# Patient Record
Sex: Female | Born: 1960 | Race: White | Hispanic: No | Marital: Single | Smoking: Never smoker
Health system: Southern US, Community
[De-identification: ages and names within clinical notes are randomized; demographics above are authoritative.]

## PROBLEM LIST (undated history)

## (undated) DIAGNOSIS — Z944 Liver transplant status: Secondary | ICD-10-CM

## (undated) DIAGNOSIS — K769 Liver disease, unspecified: Secondary | ICD-10-CM

## (undated) DIAGNOSIS — K469 Unspecified abdominal hernia without obstruction or gangrene: Secondary | ICD-10-CM

## (undated) HISTORY — PX: HERNIA REPAIR: SHX51

## (undated) HISTORY — PX: LIVER TRANSPLANT: SHX410

## (undated) HISTORY — DX: Liver disease, unspecified: K76.9

## (undated) HISTORY — DX: Unspecified abdominal hernia without obstruction or gangrene: K46.9

---

## 2004-07-13 DIAGNOSIS — K469 Unspecified abdominal hernia without obstruction or gangrene: Secondary | ICD-10-CM

## 2004-07-13 HISTORY — DX: Unspecified abdominal hernia without obstruction or gangrene: K46.9

## 2007-07-18 ENCOUNTER — Ambulatory Visit: Payer: Self-pay | Admitting: Family Medicine

## 2007-07-19 ENCOUNTER — Ambulatory Visit: Payer: Self-pay | Admitting: Family Medicine

## 2007-08-05 ENCOUNTER — Ambulatory Visit: Payer: Self-pay | Admitting: Family Medicine

## 2007-08-09 ENCOUNTER — Ambulatory Visit: Payer: Self-pay | Admitting: Family Medicine

## 2007-08-26 ENCOUNTER — Ambulatory Visit: Payer: Self-pay | Admitting: Internal Medicine

## 2007-08-29 ENCOUNTER — Other Ambulatory Visit: Payer: Self-pay

## 2007-08-29 ENCOUNTER — Emergency Department: Payer: Self-pay | Admitting: Emergency Medicine

## 2008-04-16 ENCOUNTER — Other Ambulatory Visit: Admission: RE | Admit: 2008-04-16 | Discharge: 2008-04-16 | Payer: Self-pay | Admitting: Family Medicine

## 2008-05-06 IMAGING — RF DG UGI W/O KUB
1 series · 15 of 24 positions shown · non-contrast
Comparison: none

REASON FOR EXAM: reflux
COMMENTS:

[Series 1: run · 15 of 33 slices shown]
[im 1/33]
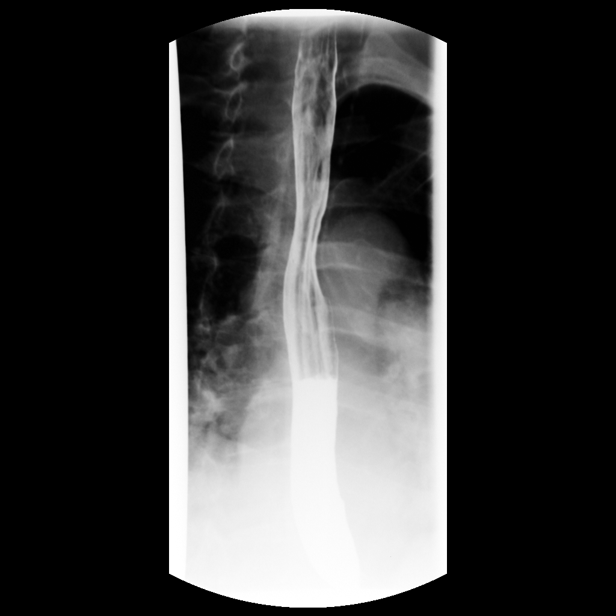
[im 3/33]
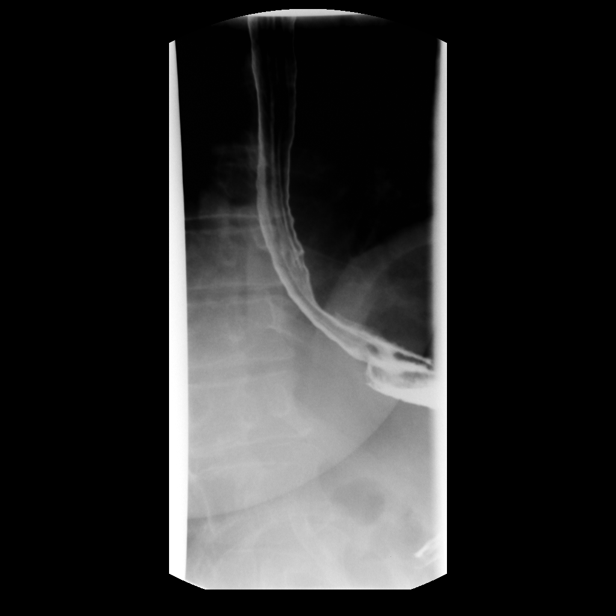
[im 6/33]
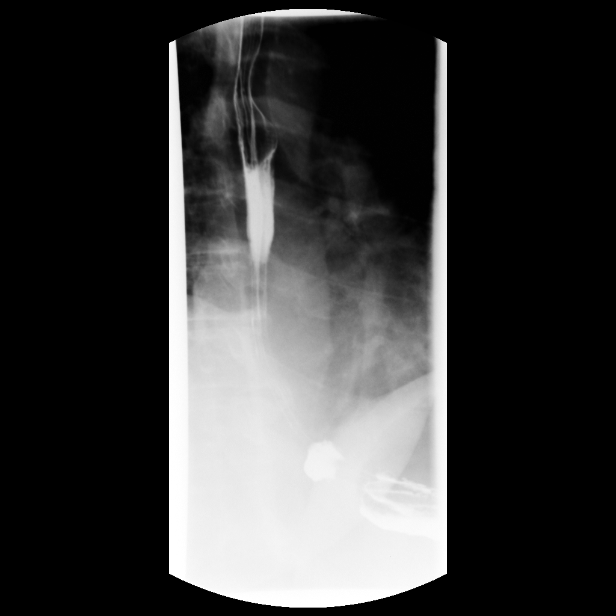
[im 7/33]
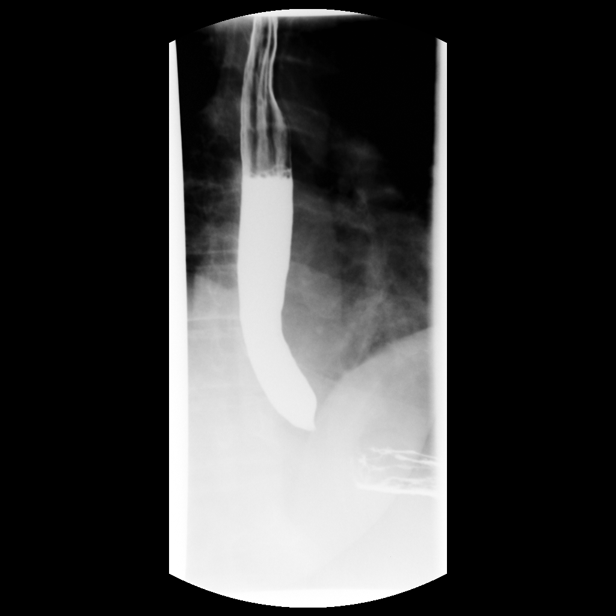
[im 10/33]
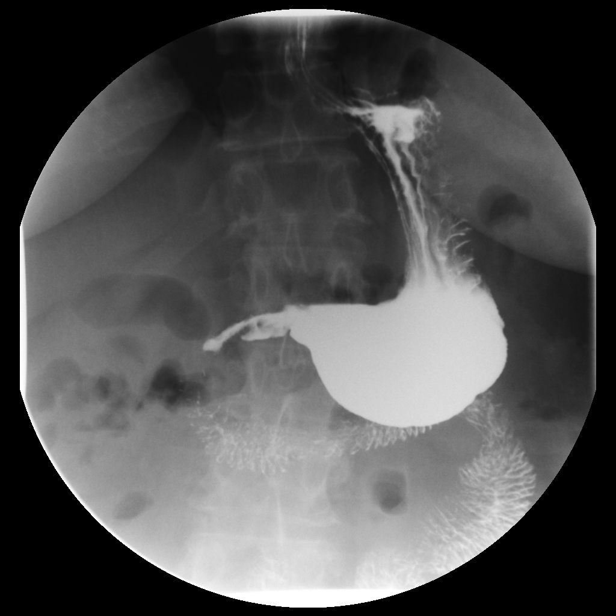
[im 12/33]
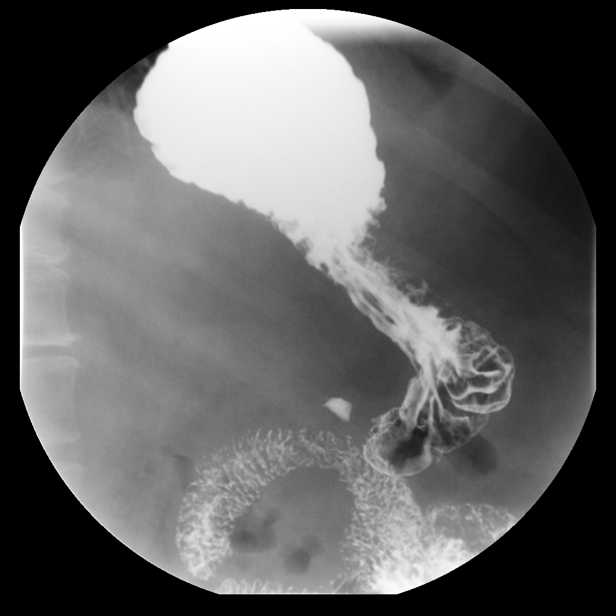
[im 14/33]
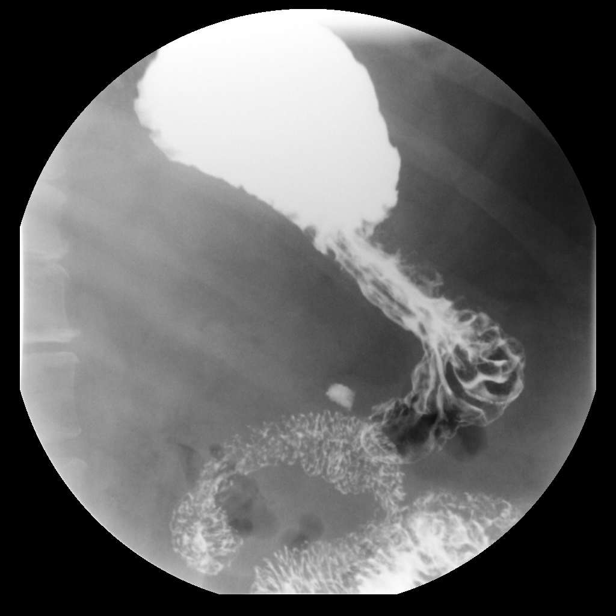
[im 17/33]
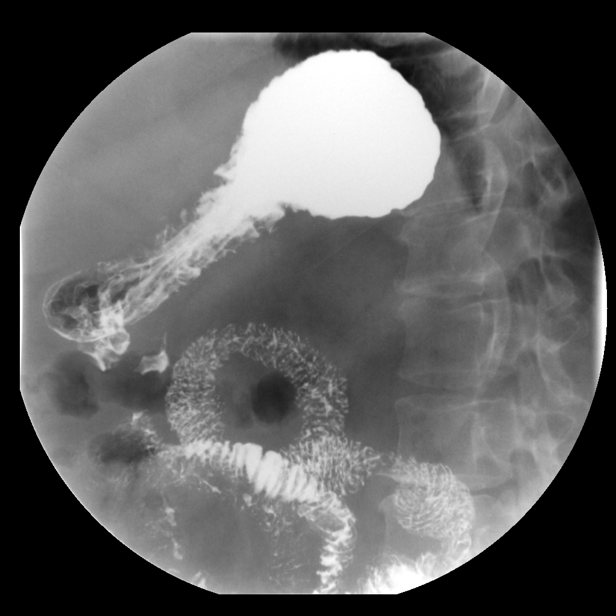
[im 19/33]
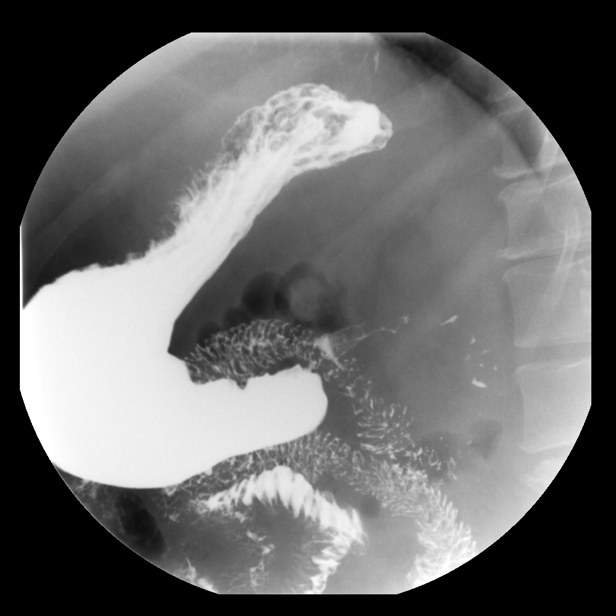
[im 21/33]
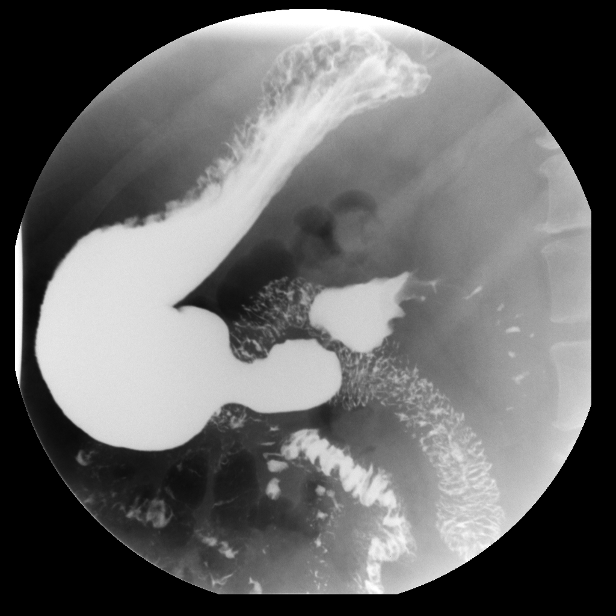
[im 23/33]
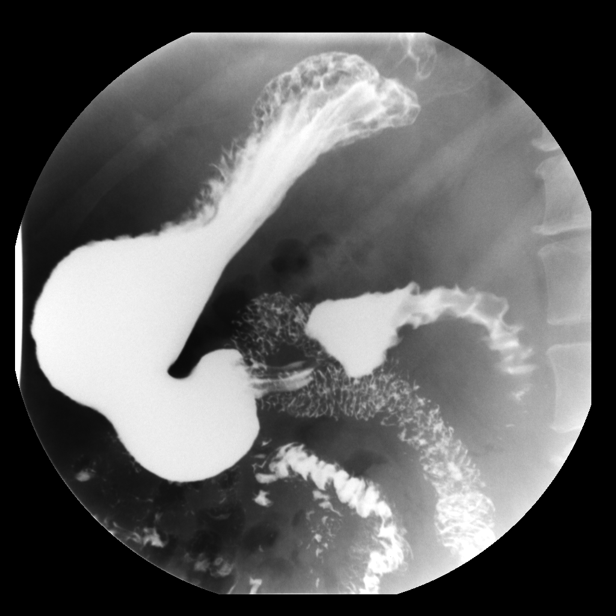
[im 26/33]
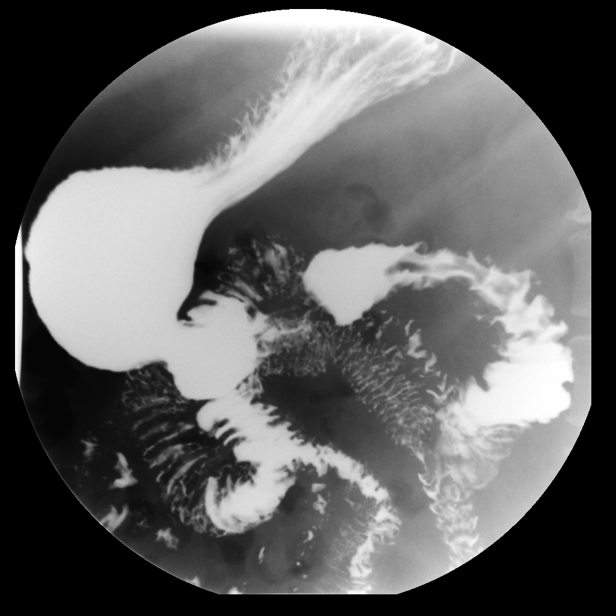
[im 28/33]
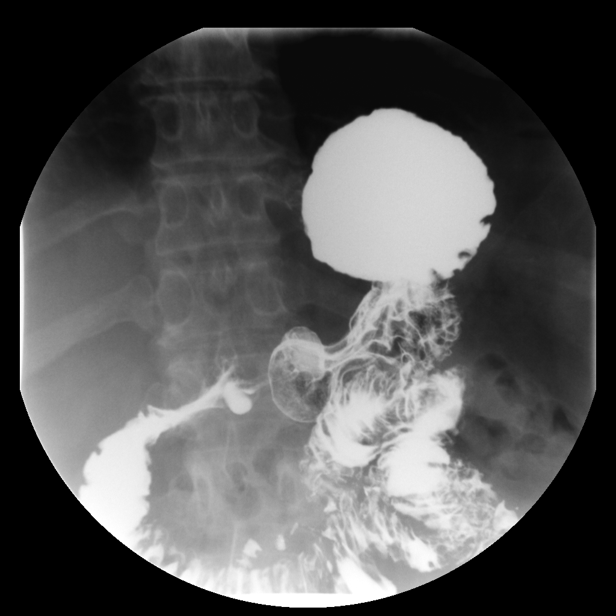
[im 30/33]
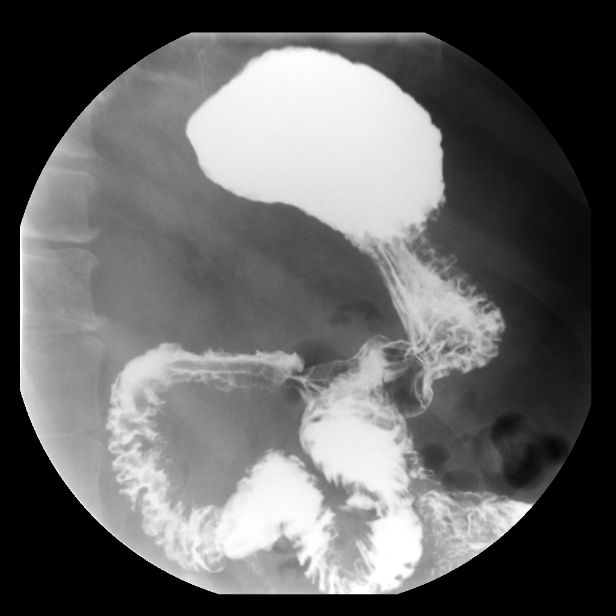
[im 33/33]
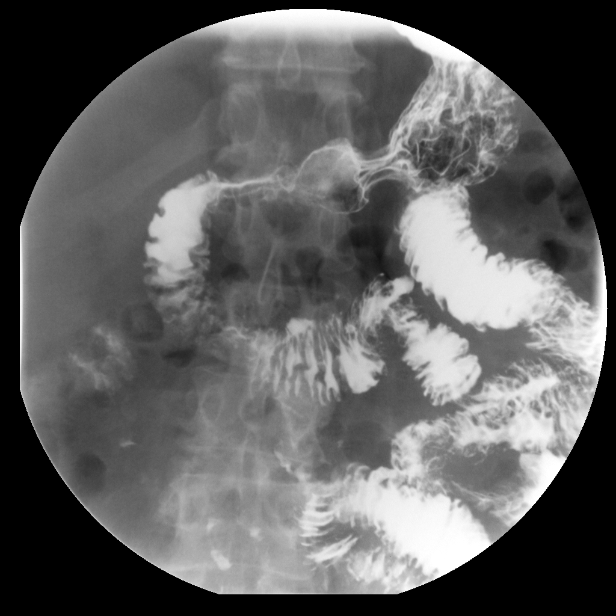

[15 of 24 positions shown; findings below may reference images not displayed]

PROCEDURE:     FL  - FL UPPER GI  - August 26, 2007 [DATE]

RESULT:     The patient was given a single cup of oral barium.  The patient
could not tolerate further administration of barium and thus this study is
limited.  The barium was administered status post effervescent crystals and
during the procedure the patient released the majority of the gas produced
by the crystals through burping. The patient was instructed not to burp but
explained that her burping was uncontrollable.

Evaluation of the stomach, duodenum and duodenal bulb demonstrate no
radiographic abnormalities.  There is appropriate rotation of the duodenum
and placement of the ligament of Treitz. The duodenal bulb is unremarkable.
There is no evidence of gastroesophageal reflux or gross evidence of a
hiatal hernia.
IMPRESSION: Upper GI evaluation as described above without evidence of gross
abnormalities.

## 2009-02-05 ENCOUNTER — Emergency Department: Payer: Self-pay | Admitting: Emergency Medicine

## 2010-03-17 ENCOUNTER — Emergency Department (HOSPITAL_COMMUNITY): Admission: EM | Admit: 2010-03-17 | Discharge: 2010-03-17 | Payer: Self-pay | Admitting: Emergency Medicine

## 2010-04-14 ENCOUNTER — Encounter: Admission: RE | Admit: 2010-04-14 | Discharge: 2010-04-14 | Payer: Self-pay | Admitting: General Surgery

## 2010-07-18 ENCOUNTER — Ambulatory Visit (HOSPITAL_COMMUNITY): Admission: RE | Admit: 2010-07-18 | Payer: Self-pay | Source: Home / Self Care | Admitting: General Surgery

## 2010-07-24 ENCOUNTER — Encounter
Admission: RE | Admit: 2010-07-24 | Discharge: 2010-07-24 | Payer: Self-pay | Source: Home / Self Care | Attending: Family Medicine | Admitting: Family Medicine

## 2010-08-01 ENCOUNTER — Encounter
Admission: RE | Admit: 2010-08-01 | Discharge: 2010-08-01 | Payer: Self-pay | Source: Home / Self Care | Attending: Family Medicine | Admitting: Family Medicine

## 2010-08-06 LAB — DIFFERENTIAL
Basophils Absolute: 0 10*3/uL (ref 0.0–0.1)
Eosinophils Relative: 3 % (ref 0–5)
Lymphocytes Relative: 37 % (ref 12–46)
Lymphs Abs: 1.4 10*3/uL (ref 0.7–4.0)
Monocytes Absolute: 0.4 10*3/uL (ref 0.1–1.0)
Neutro Abs: 1.9 10*3/uL (ref 1.7–7.7)

## 2010-08-06 LAB — COMPREHENSIVE METABOLIC PANEL
ALT: 16 U/L (ref 0–35)
AST: 24 U/L (ref 0–37)
Albumin: 3.7 g/dL (ref 3.5–5.2)
Alkaline Phosphatase: 63 U/L (ref 39–117)
Chloride: 107 mEq/L (ref 96–112)
GFR calc Af Amer: >60 mL/min (ref >60)
Potassium: 4.6 mEq/L (ref 3.5–5.1)
Sodium: 141 mEq/L (ref 135–145)
Total Bilirubin: 0.8 mg/dL (ref 0.3–1.2)
Total Protein: 6.8 g/dL (ref 6.0–8.3)

## 2010-08-06 LAB — SURGICAL PCR SCREEN
MRSA, PCR: NEGATIVE
Staphylococcus aureus: NEGATIVE

## 2010-08-06 LAB — URINALYSIS, ROUTINE W REFLEX MICROSCOPIC
Bilirubin Urine: NEGATIVE
Hgb urine dipstick: NEGATIVE
Ketones, ur: NEGATIVE mg/dL
Protein, ur: NEGATIVE mg/dL
Urine Glucose, Fasting: NEGATIVE mg/dL
Urobilinogen, UA: 0.2 mg/dL (ref 0.0–1.0)

## 2010-08-06 LAB — APTT: aPTT: 31 seconds (ref 24–37)

## 2010-08-06 LAB — CBC
HCT: 36.7 % (ref 36.0–46.0)
Hemoglobin: 12.1 g/dL (ref 12.0–15.0)
MCV: 92.7 fL (ref 78.0–100.0)
RDW: 12.8 % (ref 11.5–15.5)
WBC: 3.9 10*3/uL — ABNORMAL LOW (ref 4.0–10.5)

## 2010-08-06 LAB — URINE MICROSCOPIC-ADD ON

## 2010-08-06 LAB — PROTIME-INR: INR: 1.07 (ref 0.00–1.49)

## 2010-08-12 ENCOUNTER — Inpatient Hospital Stay (HOSPITAL_COMMUNITY)
Admission: RE | Admit: 2010-08-12 | Discharge: 2010-08-15 | DRG: 354 | Disposition: A | Discharge status: Home / Self Care | Payer: PRIVATE HEALTH INSURANCE | Attending: General Surgery | Admitting: General Surgery

## 2010-08-12 DIAGNOSIS — Z79899 Other long term (current) drug therapy: Secondary | ICD-10-CM

## 2010-08-12 DIAGNOSIS — Z944 Liver transplant status: Secondary | ICD-10-CM

## 2010-08-12 DIAGNOSIS — K432 Incisional hernia without obstruction or gangrene: Principal | ICD-10-CM | POA: Diagnosis present

## 2010-08-12 NOTE — Op Note (Signed)
NAMEALFRED, ECKLEY               ACCOUNT NO.:  [id number]  MEDICAL RECORD NO.:  [id number]          PATIENT TYPE:  INP  LOCATION:  1526                         FACILITY:  [facility]  PHYSICIAN:  Angelia Mould. Derrell Lolling, M.D.DATE OF BIRTH:  [birth year]  DATE OF PROCEDURE:  08/12/2010 DATE OF DISCHARGE:                              OPERATIVE REPORT   PREOPERATIVE DIAGNOSES: 1. Ventral incisional hernia. 2. History of liver transplant.  POSTOPERATIVE DIAGNOSES: 1. Ventral incisional hernia. 2. History of liver transplant.  OPERATION PERFORMED:  Open repair ventral incisional hernia with Inlay PhysioMesh.  SURGEON:  Claud Kelp, M.D.  FIRST ASSISTANT:  Maisie Fus A. Cornett, M.D.  OPERATIVE INDICATIONS:  This is a 50 year old Caucasian female who had a liver transplant in [place] in 2009.  She also had a TIPS procedure.  She done well with the liver transplant and is on anti-rejection drugs.  She has done well and is working full-time.  She has noticed a bulge and some discomfort in the upper midline for about a year.  I have been following her in the office for a few months and have evaluated her for her hernia.  On exam, she has a reducible hernia in the subxiphoid area in the midline at the apex of her Chevron incision.  The defect is probably 4-5 cm in size.  I have done a CT scan to map out the anatomy of the liver transplant.  She is felt to be an acceptable candidate for open surgery, is brought to operating room electively.  OPERATIVE TECHNIQUE:  Following induction of the general endotracheal anesthesia, a Foley catheter was placed.  Intravenous antibiotics were given.  Surgical time-out was held identifying correct patient and correct procedure.  I opened up the central portion of the Chevron incision superiorly going through the old scar.  Dissection was carried down through subcutaneous tissue.  I entered a moderately large hernia sac and then was able to identify the edge  of the fascia and palpate it with the intra-abdominal contents.  Under direct vision, I debrided the hernia sac.  Then with Kocher clamps, lifting up the edge of the fascia, I slowly took down the adhesions on the undersurface of the abdominal wall.  She had a few at omental adhesions inferiorly which I took down. There were no intestinal adhesions inferiorly.  Superiorly, the left lateral edge of the liver was encountered but was easily dissected away from the undersurface with electrocautery, and there was no bleeding. Once I did all of the adhesions, I had a 4-5 cm rim of normal abdominal wall both externally and internally.  I undermined the  subcutaneous tissue a little bit all the way around.  Self-retaining retractors were placed.  I brought a piece of PhysioMesh to the operative field.  I trimmed this down to about 12-14 cm in diameter. The defect measured about 5 cm transversely by about 4 cm vertically. The mesh was then sutured to the abdominal wall as an Inlay technique. I used interrupted mattress sutures of #1 Novafil placing them first at the right corner and then individually placing mattress sutures circumferentially.  We tied the  sutures from time to time as we went to make sure that we get the mesh smoothed out and there were no gaps between the fixation sutures.  The last 7 or 8 sutures were left untied until we had them all in place.  We then inserted the mesh in the abdominal cavity and lifted up all of the Novafil sutures.  We felt all the way around, and there was no defect in the repair, and there was no intra-abdominal viscera caught in the repair.  We then tied all of the sutures.  We irrigated the wound extensively.  Hemostasis was excellent and received electrocautery.  The hernia defect was then closed on top of the mesh with multiple interrupted sutures of #1 Novafil.  A 19- [place] Blake drain was placed in the deep subcutaneous tissue and brought out in the  left upper quadrant through a separate stab incision, sutured to the skin with nylon suture and connected to suction bulb. Subcutaneous tissue was closed with interrupted suture of 3-0 Vicryl and skin closed with skin staples.  Clean bandages and a Velcro binder were placed.  The patient tolerated the procedure well, was taken recovery in stable condition.  Estimated blood loss was about 50 mL.  Complications none.  Sponge, needles, and instrument counts were correct.     Angelia Mould. Derrell Lolling, M.D.     HMI/MEDQ  D:  08/12/2010  T:  08/12/2010  Job:  [id number]  cc:   Stacie Acres. Cliffton Asters, M.D. Fax: [phone]  Dr. Quita Skye. [facility] [place] [address]  Electronically Signed by Claud Kelp M.D. on 08/12/2010 05:53:25 PM

## 2010-08-19 ENCOUNTER — Inpatient Hospital Stay (HOSPITAL_COMMUNITY)
Admission: EM | Admit: 2010-08-19 | Discharge: 2010-08-25 | DRG: 393 | Disposition: A | Discharge status: Home / Self Care | Payer: No Typology Code available for payment source | Attending: General Surgery | Admitting: General Surgery

## 2010-08-19 ENCOUNTER — Emergency Department (HOSPITAL_COMMUNITY): Payer: No Typology Code available for payment source

## 2010-08-19 DIAGNOSIS — Z79899 Other long term (current) drug therapy: Secondary | ICD-10-CM

## 2010-08-19 DIAGNOSIS — K653 Choleperitonitis: Secondary | ICD-10-CM | POA: Diagnosis present

## 2010-08-19 DIAGNOSIS — L539 Erythematous condition, unspecified: Secondary | ICD-10-CM | POA: Diagnosis not present

## 2010-08-19 DIAGNOSIS — Y832 Surgical operation with anastomosis, bypass or graft as the cause of abnormal reaction of the patient, or of later complication, without mention of misadventure at the time of the procedure: Secondary | ICD-10-CM | POA: Diagnosis present

## 2010-08-19 DIAGNOSIS — Z944 Liver transplant status: Secondary | ICD-10-CM

## 2010-08-19 DIAGNOSIS — N289 Disorder of kidney and ureter, unspecified: Secondary | ICD-10-CM | POA: Diagnosis present

## 2010-08-19 DIAGNOSIS — E039 Hypothyroidism, unspecified: Secondary | ICD-10-CM | POA: Diagnosis present

## 2010-08-19 DIAGNOSIS — Z9889 Other specified postprocedural states: Secondary | ICD-10-CM

## 2010-08-19 DIAGNOSIS — K929 Disease of digestive system, unspecified: Principal | ICD-10-CM | POA: Diagnosis present

## 2010-08-19 DIAGNOSIS — K831 Obstruction of bile duct: Secondary | ICD-10-CM | POA: Diagnosis present

## 2010-08-19 DIAGNOSIS — K219 Gastro-esophageal reflux disease without esophagitis: Secondary | ICD-10-CM | POA: Diagnosis present

## 2010-08-19 DIAGNOSIS — K5900 Constipation, unspecified: Secondary | ICD-10-CM | POA: Diagnosis not present

## 2010-08-19 DIAGNOSIS — R0902 Hypoxemia: Secondary | ICD-10-CM | POA: Diagnosis present

## 2010-08-19 DIAGNOSIS — K838 Other specified diseases of biliary tract: Secondary | ICD-10-CM | POA: Diagnosis present

## 2010-08-19 LAB — URINALYSIS, ROUTINE W REFLEX MICROSCOPIC
Ketones, ur: 15 mg/dL — AB
Nitrite: POSITIVE — AB
Protein, ur: 30 mg/dL — AB
Urobilinogen, UA: 1 mg/dL (ref 0.0–1.0)
pH: 5 (ref 5.0–8.0)

## 2010-08-19 LAB — DIFFERENTIAL
Basophils Absolute: 0 10*3/uL (ref 0.0–0.1)
Lymphocytes Relative: 5 % — ABNORMAL LOW (ref 12–46)
Lymphs Abs: 0.5 10*3/uL — ABNORMAL LOW (ref 0.7–4.0)
Monocytes Absolute: 0.7 10*3/uL (ref 0.1–1.0)
Neutro Abs: 10.7 10*3/uL — ABNORMAL HIGH (ref 1.7–7.7)

## 2010-08-19 LAB — URINE MICROSCOPIC-ADD ON

## 2010-08-19 LAB — CBC
Platelets: 174 10*3/uL (ref 150–400)
RBC: 4.35 MIL/uL (ref 3.87–5.11)
RDW: 12.7 % (ref 11.5–15.5)
WBC: 11.9 10*3/uL — ABNORMAL HIGH (ref 4.0–10.5)

## 2010-08-19 LAB — COMPREHENSIVE METABOLIC PANEL
AST: 20 U/L (ref 0–37)
Albumin: 3.4 g/dL — ABNORMAL LOW (ref 3.5–5.2)
Alkaline Phosphatase: 120 U/L — ABNORMAL HIGH (ref 39–117)
BUN: 22 mg/dL (ref 6–23)
Chloride: 104 mEq/L (ref 96–112)
GFR calc Af Amer: 55 mL/min — ABNORMAL LOW (ref >60)
Potassium: 4 mEq/L (ref 3.5–5.1)
Total Protein: 7.1 g/dL (ref 6.0–8.3)

## 2010-08-19 LAB — LACTIC ACID, PLASMA: Lactic Acid, Venous: 2.2 mmol/L (ref 0.5–2.2)

## 2010-08-19 LAB — APTT: aPTT: 31 seconds (ref 24–37)

## 2010-08-19 LAB — LIPASE, BLOOD: Lipase: 18 U/L (ref 11–59)

## 2010-08-19 LAB — BLOOD GAS, ARTERIAL
Drawn by: 330991
pCO2 arterial: 34.2 mmHg — ABNORMAL LOW (ref 35.0–45.0)
pH, Arterial: 7.398 (ref 7.350–7.400)

## 2010-08-19 LAB — CK TOTAL AND CKMB (NOT AT ARMC)
CK, MB: 0.5 ng/mL (ref 0.3–4.0)
Relative Index: INVALID (ref 0.0–2.5)
Total CK: 23 U/L (ref 7–177)

## 2010-08-19 LAB — PROTIME-INR: INR: 1.16 (ref 0.00–1.49)

## 2010-08-19 LAB — AMYLASE: Amylase: 58 U/L (ref 0–105)

## 2010-08-19 MED ORDER — IOHEXOL 300 MG/ML  SOLN
100.0000 mL | Freq: Once | INTRAMUSCULAR | Status: AC | PRN
  Administered 2010-08-19: 100 mL via INTRAVENOUS

## 2010-08-20 ENCOUNTER — Inpatient Hospital Stay (HOSPITAL_COMMUNITY): Payer: No Typology Code available for payment source

## 2010-08-20 LAB — CBC
HCT: 38 % (ref 36.0–46.0)
MCH: 31.2 pg (ref 26.0–34.0)
MCHC: 34.5 g/dL (ref 30.0–36.0)
MCV: 90.5 fL (ref 78.0–100.0)
Platelets: 172 10*3/uL (ref 150–400)
RDW: 12.8 % (ref 11.5–15.5)
WBC: 14.2 10*3/uL — ABNORMAL HIGH (ref 4.0–10.5)

## 2010-08-20 LAB — COMPREHENSIVE METABOLIC PANEL
Alkaline Phosphatase: 109 U/L (ref 39–117)
BUN: 19 mg/dL (ref 6–23)
Creatinine, Ser: 1.08 mg/dL (ref 0.4–1.2)
Glucose, Bld: 129 mg/dL — ABNORMAL HIGH (ref 70–99)
Potassium: 4.9 mEq/L (ref 3.5–5.1)
Total Bilirubin: 1.6 mg/dL — ABNORMAL HIGH (ref 0.3–1.2)
Total Protein: 6 g/dL (ref 6.0–8.3)

## 2010-08-20 LAB — LIPASE, BLOOD: Lipase: 13 U/L (ref 11–59)

## 2010-08-20 MED ORDER — IOHEXOL 300 MG/ML  SOLN
100.0000 mL | Freq: Once | INTRAMUSCULAR | Status: AC | PRN
  Administered 2010-08-20: 100 mL via INTRAVENOUS

## 2010-08-21 LAB — CBC
HCT: 31.5 % — ABNORMAL LOW (ref 36.0–46.0)
Platelets: 159 10*3/uL (ref 150–400)
RBC: 3.43 MIL/uL — ABNORMAL LOW (ref 3.87–5.11)
RDW: 12.9 % (ref 11.5–15.5)
WBC: 9.4 10*3/uL (ref 4.0–10.5)

## 2010-08-21 LAB — COMPREHENSIVE METABOLIC PANEL
ALT: 8 U/L (ref 0–35)
Albumin: 2.3 g/dL — ABNORMAL LOW (ref 3.5–5.2)
Alkaline Phosphatase: 81 U/L (ref 39–117)
Chloride: 108 mEq/L (ref 96–112)
Potassium: 4.4 mEq/L (ref 3.5–5.1)
Sodium: 136 mEq/L (ref 135–145)
Total Protein: 5 g/dL — ABNORMAL LOW (ref 6.0–8.3)

## 2010-08-22 LAB — DIFFERENTIAL
Eosinophils Absolute: 0.1 10*3/uL (ref 0.0–0.7)
Eosinophils Relative: 1 % (ref 0–5)
Lymphs Abs: 1.1 10*3/uL (ref 0.7–4.0)
Monocytes Absolute: 0.5 10*3/uL (ref 0.1–1.0)
Monocytes Relative: 6 % (ref 3–12)

## 2010-08-22 LAB — BASIC METABOLIC PANEL
BUN: 21 mg/dL (ref 6–23)
GFR calc non Af Amer: 49 mL/min — ABNORMAL LOW (ref >60)
Glucose, Bld: 95 mg/dL (ref 70–99)
Potassium: 4.3 mEq/L (ref 3.5–5.1)

## 2010-08-22 LAB — CBC
MCH: 31.3 pg (ref 26.0–34.0)
MCV: 91.3 fL (ref 78.0–100.0)
Platelets: 195 10*3/uL (ref 150–400)
RBC: 3.68 MIL/uL — ABNORMAL LOW (ref 3.87–5.11)
RDW: 12.6 % (ref 11.5–15.5)

## 2010-08-23 ENCOUNTER — Inpatient Hospital Stay (HOSPITAL_COMMUNITY): Payer: No Typology Code available for payment source

## 2010-08-23 DIAGNOSIS — R932 Abnormal findings on diagnostic imaging of liver and biliary tract: Secondary | ICD-10-CM

## 2010-08-23 DIAGNOSIS — K831 Obstruction of bile duct: Secondary | ICD-10-CM

## 2010-08-23 LAB — BASIC METABOLIC PANEL
BUN: 19 mg/dL (ref 6–23)
Calcium: 8.5 mg/dL (ref 8.4–10.5)
GFR calc non Af Amer: 46 mL/min — ABNORMAL LOW (ref >60)
Glucose, Bld: 87 mg/dL (ref 70–99)
Sodium: 137 mEq/L (ref 135–145)

## 2010-08-23 LAB — CBC
HCT: 28.4 % — ABNORMAL LOW (ref 36.0–46.0)
MCHC: 33.8 g/dL (ref 30.0–36.0)
RDW: 12.4 % (ref 11.5–15.5)

## 2010-08-24 DIAGNOSIS — K831 Obstruction of bile duct: Secondary | ICD-10-CM

## 2010-08-24 DIAGNOSIS — R933 Abnormal findings on diagnostic imaging of other parts of digestive tract: Secondary | ICD-10-CM

## 2010-08-24 DIAGNOSIS — K769 Liver disease, unspecified: Secondary | ICD-10-CM

## 2010-08-24 LAB — BODY FLUID CULTURE: Gram Stain: NONE SEEN

## 2010-08-24 NOTE — H&P (Signed)
Miranda Freeman, Miranda Freeman               ACCOUNT NO.:  [id number]  MEDICAL RECORD NO.:  [id number]           PATIENT TYPE:  I  LOCATION:  1236                         FACILITY:  [facility]  PHYSICIAN:  Angelia Mould. Derrell Lolling, M.D.DATE OF BIRTH:  08-Oct-1960  DATE OF ADMISSION:  08/19/2010 DATE OF DISCHARGE:                             HISTORY & PHYSICAL   CHIEF COMPLAINT:  Abdominal pain.  HISTORY OF PRESENT ILLNESS:  This is a 50 year old Caucasian female who is now 3 years' status post liver transplantation and preop TIPS procedure.  She did well with that and that surgery was done in [place], [place].  One week ago, I performed an elective incisional ventral hernia repair in the upper midline in her liver transplant incision.  The hernia was repaired with an inlay mesh and the surgery was uneventful.  She was sent home with a drain in the subcutaneous tissue.  She has done relatively well and states that she had been able to eat and have bowel movements until today when at approximately 9 a.m. she developed some left upper quadrant pain, which may have been rather sudden in onset.  There was no nausea or vomiting, no fever or chills, no diarrhea.  She says the pain subsided during the day, but at about 4 p.m., the pain came back, and has been rather severe since.  She feels gassy and full and bloated.  She does not really think she has had a pain like this before.  She came to the [facility] Room where a chest x-ray suggested basilar atelectasis, but abdominal films were fairly unremarkable and there was no sign of obstruction or free air.  She is being admitted for further evaluation and management.  PAST MEDICAL HISTORY: 1. TIPS procedure. 2. Liver transplant in 2009. 3. Hypothyroidism. 4. Gastroesophageal reflux disease.  Otherwise, no medical or surgical problems.  CURRENT MEDICATIONS: 1. Prograf  3 mg in the morning and 2 mg in the evening. 2. CellCept 2 tablets  in the morning and 2 tablets in the evening,     dose unknown. 3. Pepcid p.r.n. 4. Torsemide 20 mg as needed. 5. Ferrous sulfate 1 tablet daily. 6. Dexilant capsule p.r.n. heartburn. 7. Ketoconazole cream 2% p.r.n. minor fungal dermatitis. 8. Levothyroxine 50 mcg per day. 9. She sometimes takes mycophenolate 250 mg 2 tablets twice a day.  DRUG ALLERGIES:  None known.  SOCIAL HISTORY:  She is divorced, has 1 child.  She denies alcohol, tobacco, or any drugs.  She denies taking any Advil or aspirin.  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  FAMILY HISTORY:  Mother, father, and 2 sisters are living and well without medical problems.  REVIEW OF SYSTEMS:  Ten-system review of systems is negative except as described above.  PHYSICAL EXAMINATION:  GENERAL:  A pleasant middle-aged Caucasian female who appears in obvious distress from abdominal pain.  She is a little bit pale.  SKIN:  Warm and dry. VITAL SIGNS:  Initial vital signs showed a temperature of 97.7, heart rate 96, blood pressure 110/74, respiratory rate of 18, and oxygen saturation of 99%. HEENT:  Eyes, sclerae clear.  Extraocular movements are intact.  Ears, mouth, throat, nose, lips, tongue, and oropharynx are without gross lesions. NECK:  Supple, nontender.  No masses.  No jugular venous distention. LUNGS:  Clear to auscultation on anterior exam. HEART:  Regular rate and rhythm, borderline tachycardia.  No ectopy, no murmur. ABDOMEN:  She has a bilateral subcostal incision from her old liver transplant.  The central portion of this incision has been recently reopened and has staples in place.  There is no sign of any wound infection.  There is a Jackson-Pratt drain in the left upper quadrant going to the wound, which is draining odorless and serosanguineous fluid.  She does not seem really distended, but her abdomen has minimal bowel sounds.  She is diffusely tender, but only with a little bit of guarding.  I  do not feel a mass.  I do no feel a hernia.  EXTREMITIES: She moves all 4 extremities well. NEUROLOGIC:  Other than being a little bit inappropriate from the narcotic pain medication, there is no gross motor or sensory deficit.  ADMISSION DATA:  Chest x-ray shows bibasilar atelectasis.  Abdominal x- rays show an unremarkable gas pattern without signs of obstruction or free air.  Hemoglobin 13.6, white blood cell count 11,900 but with a left shift.  Arterial blood gas shows a pH of 7.39, pCO2 of 34, and pO2 of 61 on room air.  EKG shows no acute change.  Complete metabolic panel shows a mildly elevated creatinine, bilirubin of 2.0, a mildly elevated alkaline phosphatase, and normal SGOT and SGPT.  PT and PTT are normal.  ASSESSMENT: 1. Acute abdominal pain of uncertain etiology.  There does not appear     to be any acute wound infection or wound disruption.     Considerations would be perforation, inflammatory or ischemic     process of the intestinal tract, bile leak, or constipation.  Other     considerations might be referred pain from an acute pulmonary     embolism. 2. Status post recent ventral incisional hernia repair. 3. Status post liver transplantation. 4.  dilated common bile duct on CT; question duct stricture.  PLAN: 1. The patient will be hydrated and sent for a CT scan of the chest as     a CT angio to rule out PE and then she will have a CT scan of the     abdomen and pelvis with contrast. 2. She will be admitted to the step-down bed for hydration, pain     control, and proton pump inhibitors.  We will hold off on giving     any antibiotics for now until we know better what the likely     diagnosis is.  I have discussed this with the patient and her family and with Dr. Elias Else who is involved with the family.     Angelia Mould. Derrell Lolling, M.D.     HMI/MEDQ  D:  08/19/2010  T:  08/20/2010  Job:  [id number]  cc:   Stacie Acres. Cliffton Asters, M.D. Fax:  [phone]  Electronically Signed by Claud Kelp M.D. on 08/24/2010 04:01:09 PM

## 2010-08-25 LAB — PREALBUMIN: Prealbumin: 5.4 mg/dL — ABNORMAL LOW (ref 17.0–34.0)

## 2010-08-25 LAB — COMPREHENSIVE METABOLIC PANEL
ALT: 11 U/L (ref 0–35)
AST: 13 U/L (ref 0–37)
Calcium: 8.2 mg/dL — ABNORMAL LOW (ref 8.4–10.5)
GFR calc Af Amer: >60 mL/min (ref >60)
Sodium: 140 mEq/L (ref 135–145)
Total Protein: 4.6 g/dL — ABNORMAL LOW (ref 6.0–8.3)

## 2010-08-25 LAB — CBC
MCHC: 34.6 g/dL (ref 30.0–36.0)
Platelets: 220 10*3/uL (ref 150–400)
RDW: 12.2 % (ref 11.5–15.5)

## 2010-08-26 NOTE — Discharge Summary (Signed)
NAMECALE, DECAROLIS               ACCOUNT NO.:  [id number]  MEDICAL RECORD NO.:  [id number]           PATIENT TYPE:  I  LOCATION:  1236                         FACILITY:  [facility]  PHYSICIAN:  Angelia Mould. Derrell Lolling, M.D.DATE OF BIRTH:  1961/03/13  DATE OF ADMISSION:  08/19/2010 DATE OF DISCHARGE:  08/25/2010                              DISCHARGE SUMMARY   FINAL DIAGNOSES: 1. Bile leak, controlled with percutaneous drainage. 2. Recent ventral incisional hernia repair with mesh. 3. History of TIPS procedure. 4. History of liver transplantation, 2009. 5. Hypothyroidism.  OPERATIONS PERFORMED: 1. Percutaneous drainage of epigastric fluid collection, 08/20/2010 2. Percutaneous drainage of left upper quadrant fluid collection, 08/20/2010 3. ERCP, date February 11.  HISTORY:  This is a 50 year old Caucasian female who had a TIPS procedure and then a liver transplant in [place] in 2009, and she did well with that and is on anti-rejection drugs.  She is working full time.  She came to see me about 2 or 3 months ago with a ventral hernia in the upper midline at the apex of her chevron incision.  She was evaluated for this and she wanted to consider repair.  A CT scan was done and showed a defect about 4 cm to 5 cm in size.  Only the very tip of the left lobe of the liver was anywhere near the hernia.  The intrahepatic bile ducts looked slightly dilated on the CT scan.  Her liver function tests were normal.  A coagulation profile was normal. After discussing with her primary care physician, she was felt to be an acceptable candidate for open surgery.  She was taken to the operating room on August 12, 2009.  I reopened the central portion of the chevron incision.  I dissected out the hernia sac and identified the edges of the fascia.  I lifted up the fascia and debrided a few adhesions inferiorly.  The left lateral edge of the liver was encountered and very minimal dissection was required  to dissect  this away from the undersurface of the fascia without any problem.  We then placed an inlay graft of Phsyiomesh.  She did well and went home in a couple of days.  HOSPITAL COURSE: She was readmitted at this time 7 days postop with severe mid and upper abdominal pain.  CT scan showed a large, slightly rim-enhancing fluid collection under the mesh and another non rim-enhancing fluid collection in the left upper quadrant and some ascites in the pelvis.  She was started on intravenous antibiotics.  Interventional Radiology placed a drain in the subxiphoid area and a second drain in the left upper quadrant area, and both of these areas drained clear bile.  Cultures were mostly negative, although there were a few coagulase- negative staph.  We switched her from [place] to Levaquin on February 13.  Because of the concern that she might have a common bile duct stricture which might be causing elevated biliary pressure, we considered ERCP to evaluate this.  This was discussed with Dr. Rob Bunting here in town, as well as Dr. Sharen Hint, one of the transplant surgeons in [place]. It  was felt reasonable to attempt this.  ERCP was performed on February 11.  Dr. 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  She has been stable since that time. Each drain is draining about 30 cc to 35 cc per 24-hour period, bilious fluid.  She has resumed bowel function and diet, although she is still having some upper abdominal pain and some upper abdominal tenderness. We have removed the skin staples from her incision and removed the subcutaneous drain, and the wound looks good at this time.  We felt that to resolve the situation of the common bile duct stricture and the potentially elevated biliary pressures, which might be complicating the biliary leak, that she might need to  be transferred back to [facility] to her liver transplant service.  She was very much in favor of that.  I discussed this today with Judson Roch, the nurse practitioner with the transplantation service, and with Dr. Sharen Hint, one of the transplant surgeons.  They advised that we go ahead and discharge her from the hospital and they would see her in the clinic at the [facility] in [place], [place], tomorrow and decide what the next steps were.  I expressed my concern to the patient and to the staff in [place] that one of the secondary issues was the possibility of infection of the mesh due to the bile leak and that will have to be dealt with in the future should that occur.  DISCHARGE MEDICATIONS:  Include: 1. Levaquin 750 mg p.o. daily. 2. Synthroid 50 mcg daily. 3. CellCept 1000 mg twice a day. 4. Protonix 40 mg daily. 5. Prograf 2 mg at bedtime and 3 mg in the morning. 6. Dilaudid 4 mg p.o. q.4 h. p.r.n. 7. Lactulose 15 mg to 30 mg p.o. q.6 h. p.r.n. constipation.  The patient was advised to return to see me as needed, but certainly within 1 month.     Angelia Mould. Derrell Lolling, M.D.     HMI/MEDQ  D:  08/25/2010  T:  08/25/2010  Job:  [id number]  cc:   Stacie Acres. Cliffton Asters, M.D. Fax: [phone]  Rachael Fee, MD [address] [place], [place] [address]  Dr. Rosetta Posner [facility] [place], [place]  Electronically Signed by Claud Kelp M.D. on 08/26/2010 06:42:38 AM

## 2010-09-02 ENCOUNTER — Ambulatory Visit
Admission: RE | Admit: 2010-09-02 | Discharge: 2010-09-02 | Disposition: A | Discharge status: Home / Self Care | Payer: No Typology Code available for payment source | Source: Ambulatory Visit | Attending: Family Medicine | Admitting: Family Medicine

## 2010-09-02 ENCOUNTER — Other Ambulatory Visit: Payer: Self-pay | Admitting: Family Medicine

## 2010-09-02 DIAGNOSIS — R52 Pain, unspecified: Secondary | ICD-10-CM

## 2010-09-02 DIAGNOSIS — R609 Edema, unspecified: Secondary | ICD-10-CM

## 2010-09-05 ENCOUNTER — Other Ambulatory Visit (HOSPITAL_COMMUNITY): Payer: Self-pay | Admitting: Family Medicine

## 2010-09-05 DIAGNOSIS — K9189 Other postprocedural complications and disorders of digestive system: Secondary | ICD-10-CM

## 2010-09-05 DIAGNOSIS — Z944 Liver transplant status: Secondary | ICD-10-CM

## 2010-09-13 NOTE — Discharge Summary (Signed)
[name], [name]               ACCOUNT NO.:  [id number]  MEDICAL RECORD NO.:  [id number]           PATIENT TYPE:  I  LOCATION:  1526                         FACILITY:  [facility]  PHYSICIAN:  Miranda Freeman, M.D.DATE OF BIRTH:  Feb 21, 1961  DATE OF ADMISSION:  08/12/2010 DATE OF DISCHARGE:  08/15/2010                              DISCHARGE SUMMARY   FINAL DIAGNOSES: 1. Ventral incisional hernia. 2. History of liver transplantation. 3. History of transjugular intrahepatic portosystemic shunt procedure.  OPERATIONS PERFORMED:  Open repair of ventral incisional hernia with Inlay PhysioMesh, date of surgery August 12, 2010.  HISTORY:  This is a 50 year old Caucasian female who had a liver transplantation by Dr. Quita Freeman. Miranda Freeman at [facility] in 2009, which was preceded by a TIPS procedure.  She did well with that and is on antirejection drugs.  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  She has noticed a small bulge and some discomfort in the upper midline for about 1 year and thinks that this has been getting larger.  She was examined as an outpatient and it was found that she had a reducible hernia in the epigastric area at the apex of the Chevron incision.  We worked her up with a CT scan to determine the location of the left lobe of the liver which appeared to be nearby but not hypertrophied.  We discussed her care and got clearance from her transplant team at [facility] in [place], and plans were made to proceed with elective hernia repair.  HOSPITAL COURSE:  On the day of admission, the patient was taken to operating room and underwent repair of her hernia.  We did this by making incision through the old Chevron incision centrally and dissecting out the fascia, and we also dissected the colon and the left lobe of the liver away from the undersurface of the fascia and do  an Inlay mesh repair and then closed the fascia over the top.  Postoperatively, she did well.  She became ambulatory by the evening of the first postop day, began diet slowly.  She had trouble voiding and had to have a Foley reinserted but then that ultimately came out and she did well.  She had low grade temperature to 100.3 on postop day #1, but that resolved.  By August 15, 2010, she was doing much better with diet, activities, and voiding, and pain was well-controlled, and she remained afebrile.  Her wound looked good.  She did not have a bowel movement, so we gave her some lactulose for constipation.  DISCHARGE MEDICATIONS: 1. Diphenhydramine 25 mg 2 tablets at bedtime. 2. Iron 65 mg 1 tablet a day. 3. Vitamin B complex with C 1 tablet daily. 4. Calcium with vitamin D 1 tablet b.i.d. 5. Vitamin D3 is 2000 units twice a day. 6. Glucosamine Osteo Bi-Flex 1 tablet daily. 7. Multivitamins 1 tablet daily. 8. Levothyroxine 50 mcg per day. 9. CellCept 500 mg 2 caps twice daily. 10.Prograf 1 mg she takes 2-3 caps b.i.d. 11.She was given a prescription for Vicodin  for pain.  FOLLOWUP:  She was given followup with me in the office for wound and drain management.     Miranda Freeman, M.D.     HMI/MEDQ  D:  09/13/2010  T:  09/13/2010  Job:  [id number]  cc:   Miranda Freeman, M.D. Fax: [phone]  Electronically Signed by Miranda Freeman M.D. on 09/13/2010 05:18:39 PM

## 2010-09-17 ENCOUNTER — Other Ambulatory Visit (HOSPITAL_COMMUNITY): Payer: Self-pay | Admitting: Family Medicine

## 2010-09-17 ENCOUNTER — Encounter (HOSPITAL_COMMUNITY)
Admission: RE | Admit: 2010-09-17 | Discharge: 2010-09-17 | Disposition: A | Discharge status: Home / Self Care | Payer: No Typology Code available for payment source | Source: Ambulatory Visit | Attending: Family Medicine | Admitting: Family Medicine

## 2010-09-17 ENCOUNTER — Encounter (HOSPITAL_COMMUNITY): Payer: Self-pay

## 2010-09-17 DIAGNOSIS — K9189 Other postprocedural complications and disorders of digestive system: Secondary | ICD-10-CM

## 2010-09-17 DIAGNOSIS — Z944 Liver transplant status: Secondary | ICD-10-CM | POA: Insufficient documentation

## 2010-09-17 DIAGNOSIS — Z48298 Encounter for aftercare following other organ transplant: Secondary | ICD-10-CM | POA: Insufficient documentation

## 2010-09-17 HISTORY — DX: Liver transplant status: Z94.4

## 2010-09-17 MED ORDER — TECHNETIUM TC 99M MEBROFENIN IV KIT
5.5000 | PACK | Freq: Once | INTRAVENOUS | Status: AC | PRN
  Administered 2010-09-17: 5.5 via INTRAVENOUS

## 2010-09-18 ENCOUNTER — Other Ambulatory Visit (HOSPITAL_COMMUNITY): Payer: No Typology Code available for payment source

## 2010-09-18 ENCOUNTER — Other Ambulatory Visit (HOSPITAL_COMMUNITY): Payer: Self-pay | Admitting: General Surgery

## 2010-09-18 DIAGNOSIS — K651 Peritoneal abscess: Secondary | ICD-10-CM

## 2010-09-22 ENCOUNTER — Ambulatory Visit (HOSPITAL_COMMUNITY)
Admission: RE | Admit: 2010-09-22 | Discharge: 2010-09-22 | Disposition: A | Discharge status: Home / Self Care | Payer: No Typology Code available for payment source | Source: Ambulatory Visit | Attending: General Surgery | Admitting: General Surgery

## 2010-09-22 DIAGNOSIS — K651 Peritoneal abscess: Secondary | ICD-10-CM

## 2010-09-24 ENCOUNTER — Other Ambulatory Visit: Payer: Self-pay | Admitting: Family Medicine

## 2010-09-24 DIAGNOSIS — R609 Edema, unspecified: Secondary | ICD-10-CM

## 2010-09-25 ENCOUNTER — Ambulatory Visit
Admission: RE | Admit: 2010-09-25 | Discharge: 2010-09-25 | Disposition: A | Discharge status: Home / Self Care | Payer: No Typology Code available for payment source | Source: Ambulatory Visit | Attending: Family Medicine | Admitting: Family Medicine

## 2010-09-25 DIAGNOSIS — R609 Edema, unspecified: Secondary | ICD-10-CM

## 2010-09-26 ENCOUNTER — Other Ambulatory Visit: Payer: Self-pay | Admitting: Family Medicine

## 2010-09-26 DIAGNOSIS — R52 Pain, unspecified: Secondary | ICD-10-CM

## 2010-09-26 DIAGNOSIS — I8290 Acute embolism and thrombosis of unspecified vein: Secondary | ICD-10-CM

## 2010-09-26 DIAGNOSIS — R609 Edema, unspecified: Secondary | ICD-10-CM

## 2010-10-02 ENCOUNTER — Ambulatory Visit
Admission: RE | Admit: 2010-10-02 | Discharge: 2010-10-02 | Disposition: A | Discharge status: Home / Self Care | Payer: No Typology Code available for payment source | Source: Ambulatory Visit | Attending: Family Medicine | Admitting: Family Medicine

## 2010-10-02 DIAGNOSIS — R52 Pain, unspecified: Secondary | ICD-10-CM

## 2010-10-02 DIAGNOSIS — I8290 Acute embolism and thrombosis of unspecified vein: Secondary | ICD-10-CM

## 2010-10-02 DIAGNOSIS — R609 Edema, unspecified: Secondary | ICD-10-CM

## 2010-11-19 ENCOUNTER — Encounter (INDEPENDENT_AMBULATORY_CARE_PROVIDER_SITE_OTHER): Payer: Self-pay | Admitting: General Surgery

## 2011-01-09 ENCOUNTER — Other Ambulatory Visit: Payer: Self-pay | Admitting: *Deleted

## 2011-01-09 ENCOUNTER — Other Ambulatory Visit: Payer: Self-pay | Admitting: Family Medicine

## 2011-01-09 DIAGNOSIS — R922 Inconclusive mammogram: Secondary | ICD-10-CM

## 2011-01-20 ENCOUNTER — Ambulatory Visit
Admission: RE | Admit: 2011-01-20 | Discharge: 2011-01-20 | Disposition: A | Discharge status: Home / Self Care | Payer: No Typology Code available for payment source | Source: Ambulatory Visit | Attending: Family Medicine | Admitting: Family Medicine

## 2011-01-20 DIAGNOSIS — R922 Inconclusive mammogram: Secondary | ICD-10-CM

## 2011-02-04 ENCOUNTER — Encounter (HOSPITAL_BASED_OUTPATIENT_CLINIC_OR_DEPARTMENT_OTHER): Payer: No Typology Code available for payment source | Admitting: Oncology

## 2011-02-04 ENCOUNTER — Other Ambulatory Visit: Payer: Self-pay | Admitting: Oncology

## 2011-02-04 DIAGNOSIS — Z944 Liver transplant status: Secondary | ICD-10-CM

## 2011-02-04 DIAGNOSIS — D649 Anemia, unspecified: Secondary | ICD-10-CM

## 2011-02-04 LAB — CBC & DIFF AND RETIC
Basophils Absolute: 0 10*3/uL (ref 0.0–0.1)
Eosinophils Absolute: 0.1 10*3/uL (ref 0.0–0.5)
HGB: 12.6 g/dL (ref 11.6–15.9)
Immature Retic Fract: 2.7 % (ref 0.00–10.70)
MCV: 91.1 fL (ref 79.5–101.0)
MONO#: 0.3 10*3/uL (ref 0.1–0.9)
NEUT#: 1.9 10*3/uL (ref 1.5–6.5)
RBC: 4.14 10*6/uL (ref 3.70–5.45)
RDW: 13.1 % (ref 11.2–14.5)
Retic %: 1.26 % (ref 0.50–1.50)
Retic Ct Abs: 52.16 10*3/uL (ref 18.30–72.70)
WBC: 3.5 10*3/uL — ABNORMAL LOW (ref 3.9–10.3)
lymph#: 1.2 10*3/uL (ref 0.9–3.3)
nRBC: 0 % (ref 0–0)

## 2011-02-04 LAB — MORPHOLOGY: PLT EST: DECREASED

## 2011-02-10 LAB — HYPERCOAGULABLE PANEL, COMPREHENSIVE
Anticardiolipin IgA: 3 APL U/mL (ref <22)
Anticardiolipin IgG: 6 GPL U/mL (ref <23)
Anticardiolipin IgM: 3 MPL U/mL (ref <11)
Beta-2 Glyco I IgG: 1 G Units (ref <20)
DRVVT: 37.1 secs (ref 36.2–44.3)
PTT Lupus Anticoagulant: 33.5 secs (ref 30.0–45.6)
Protein C Activity: 118 % (ref 75–133)
Protein S Activity: 104 % (ref 69–129)
Protein S Total: 125 % (ref 60–150)

## 2011-02-10 LAB — COMPREHENSIVE METABOLIC PANEL
ALT: 13 U/L (ref 0–35)
AST: 21 U/L (ref 0–37)
Alkaline Phosphatase: 72 U/L (ref 39–117)
BUN: 22 mg/dL (ref 6–23)
Calcium: 9.5 mg/dL (ref 8.4–10.5)
Chloride: 106 mEq/L (ref 96–112)
Creatinine, Ser: 1.09 mg/dL (ref 0.50–1.10)
Potassium: 4.1 mEq/L (ref 3.5–5.3)

## 2011-02-12 ENCOUNTER — Encounter (HOSPITAL_BASED_OUTPATIENT_CLINIC_OR_DEPARTMENT_OTHER): Payer: No Typology Code available for payment source | Admitting: Oncology

## 2011-02-12 DIAGNOSIS — D649 Anemia, unspecified: Secondary | ICD-10-CM

## 2011-02-12 DIAGNOSIS — Z944 Liver transplant status: Secondary | ICD-10-CM

## 2011-03-03 ENCOUNTER — Other Ambulatory Visit: Payer: Self-pay | Admitting: Surgery

## 2011-05-14 IMAGING — US US EXTREM LOW VENOUS*L*
1 series · 14 of 24 positions shown · non-contrast
Comparison: None.

CLINICAL DATA: Left calf pain and swelling.

LEFT LOWER EXTREMITY VENOUS DUPLEX ULTRASOUND
TECHNIQUE: Gray-scale sonography with graded compression, as well
as color Doppler and duplex ultrasound, were performed to evaluate
the deep venous system of the lower extremity from the level of the
common femoral vein through the popliteal and proximal calf veins.
Spectral Doppler was utilized to evaluate flow at rest and with
distal augmentation maneuvers.

[Series 1: us extrem low venous*left* · 14 of 35 slices shown]
[im 1/35]
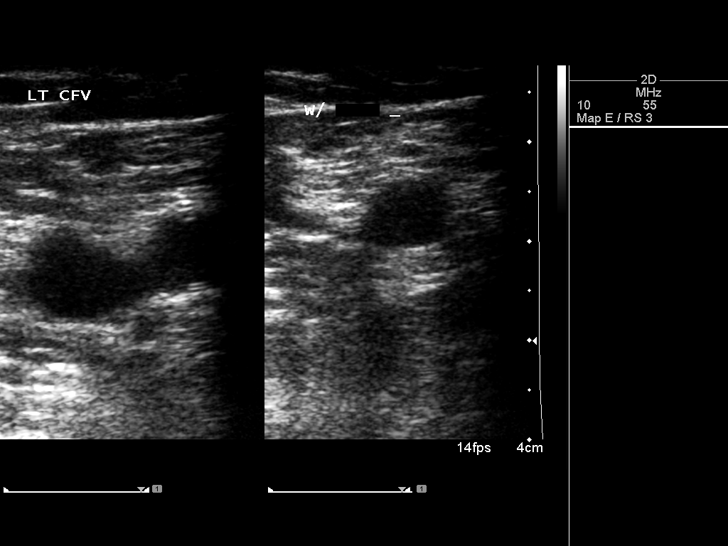
[im 3/35]
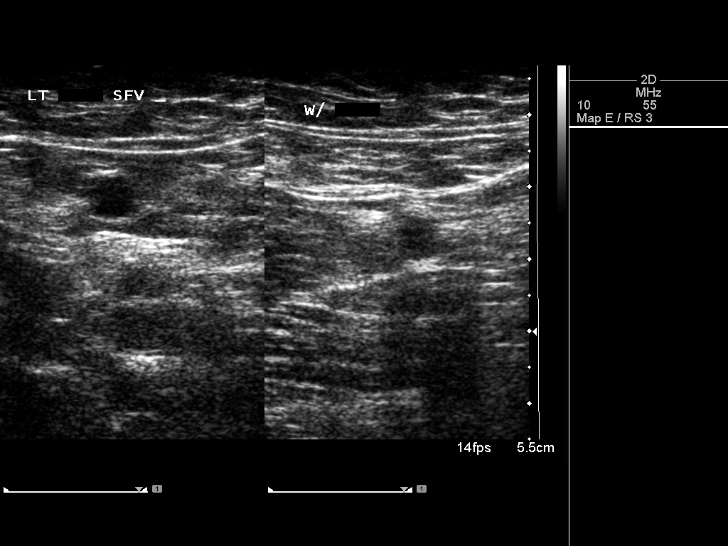
[im 6/35]
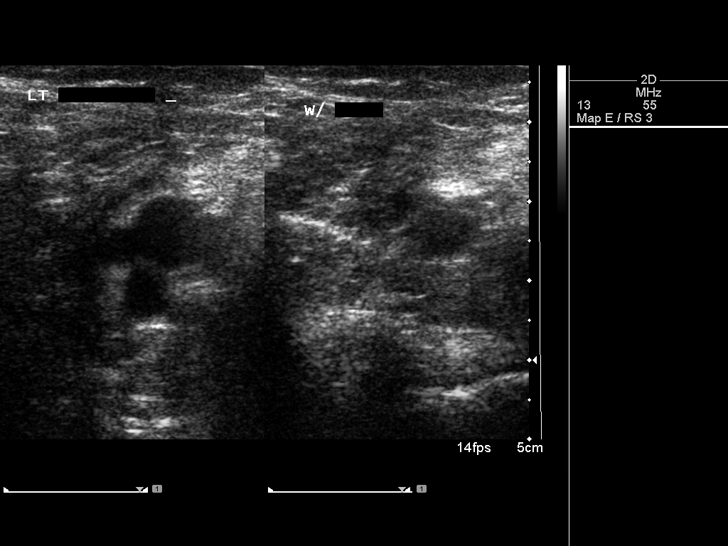
[im 9/35]
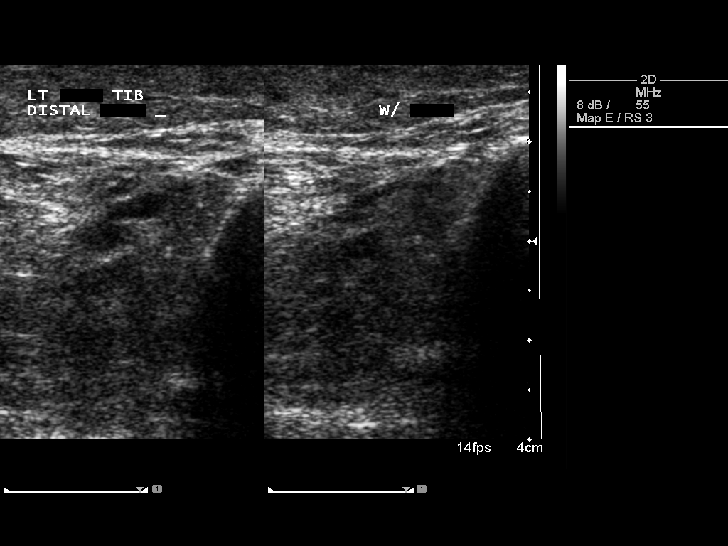
[im 11/35]
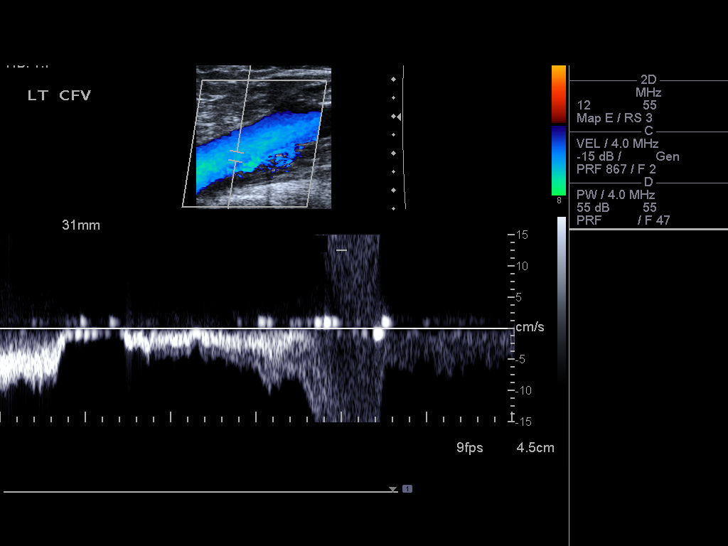
[im 14/35]
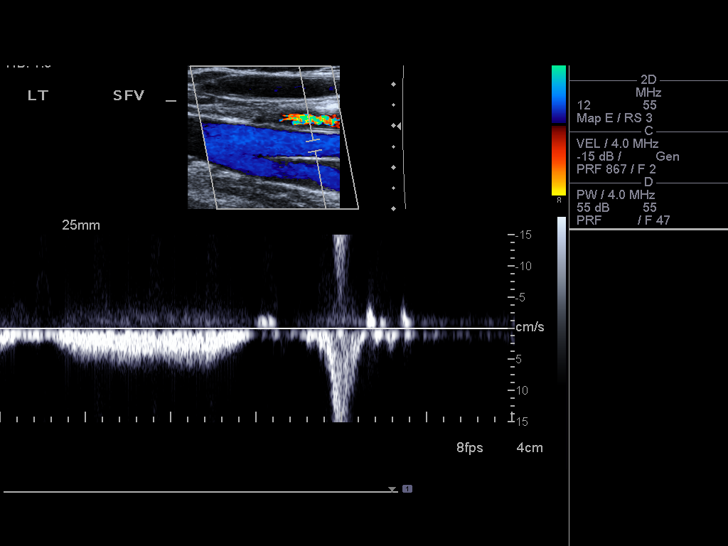
[im 17/35]
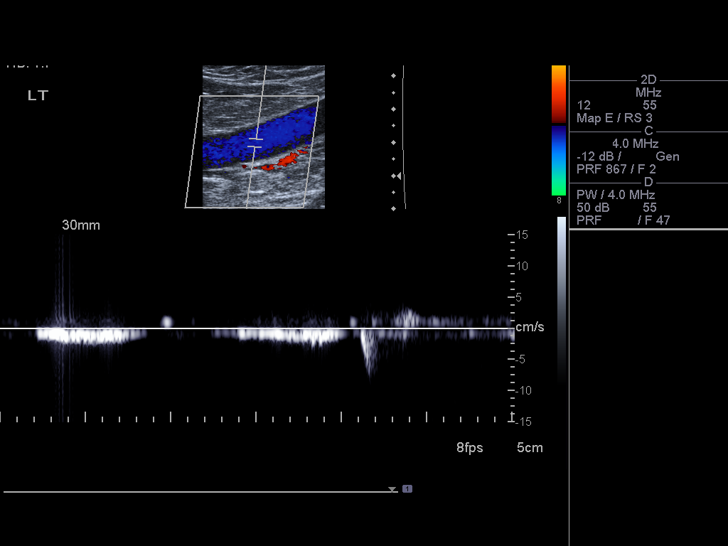
[im 18/35]
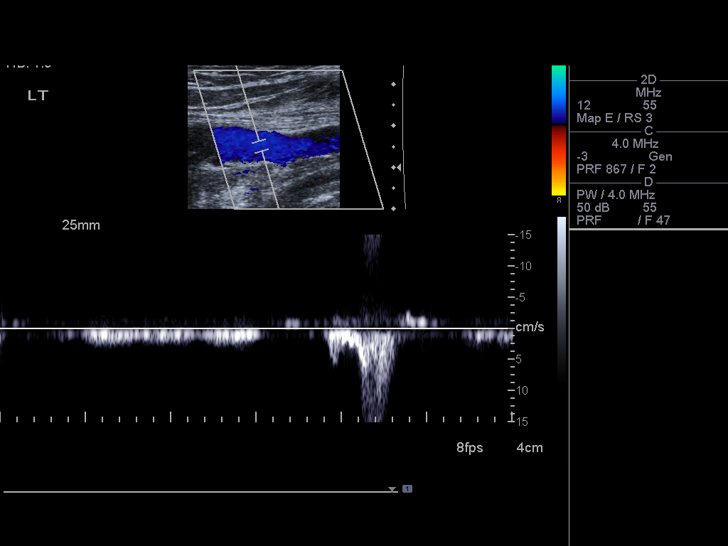
[im 21/35]
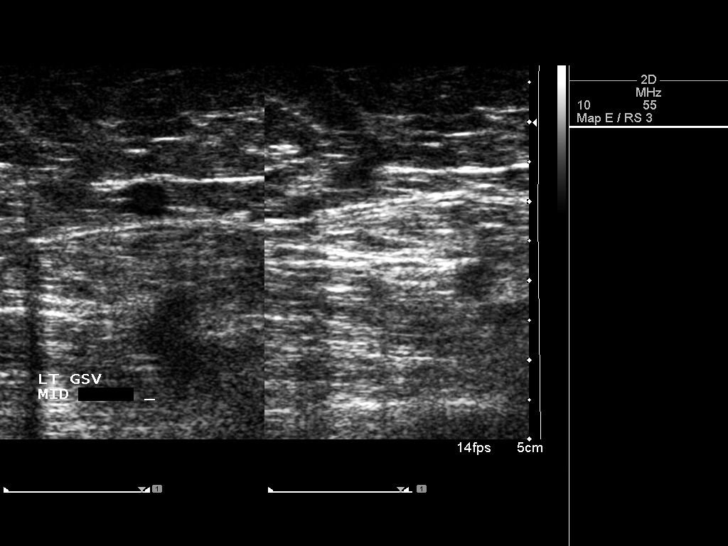
[im 24/35]
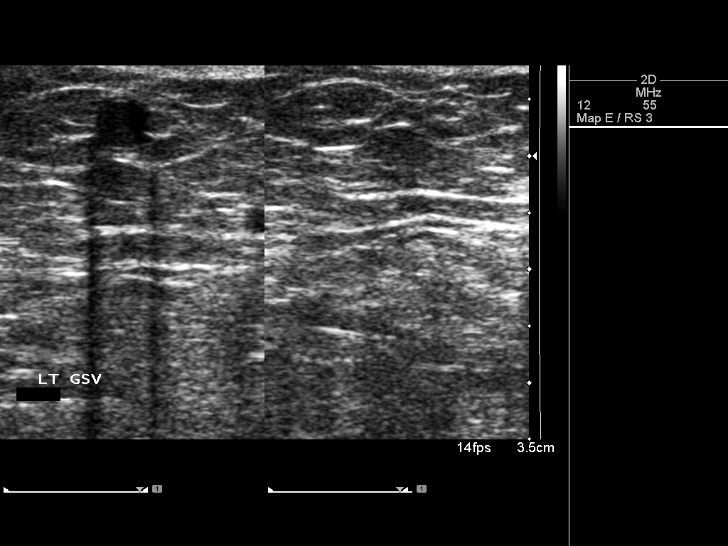
[im 27/35]
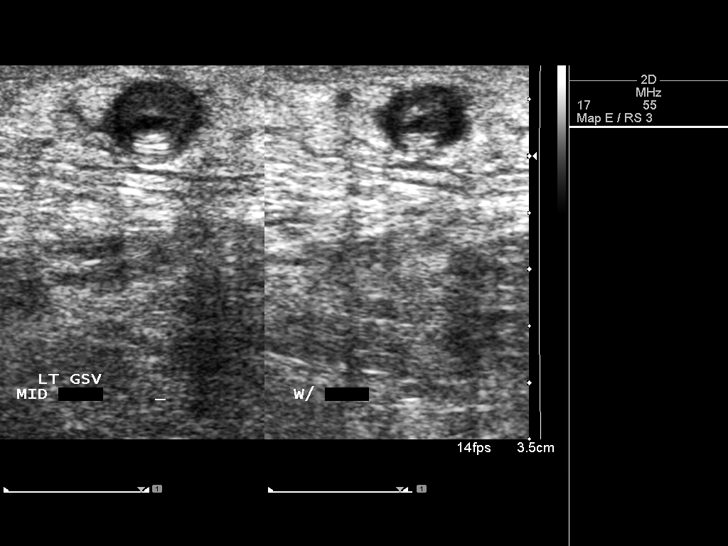
[im 29/35]
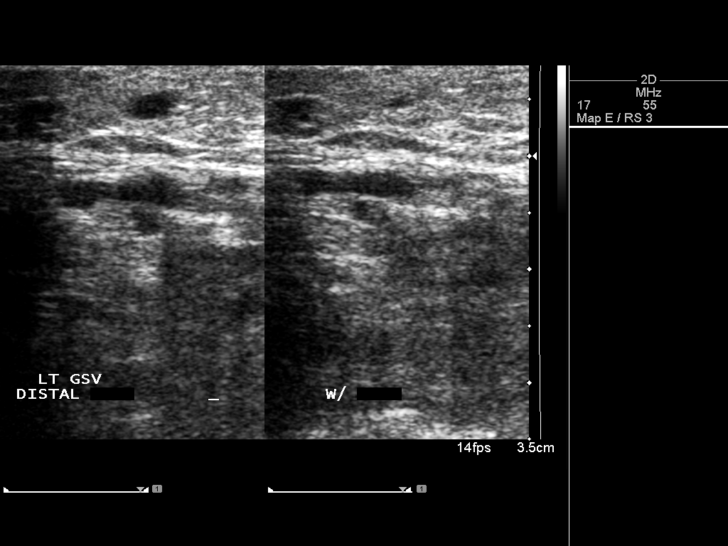
[im 32/35]
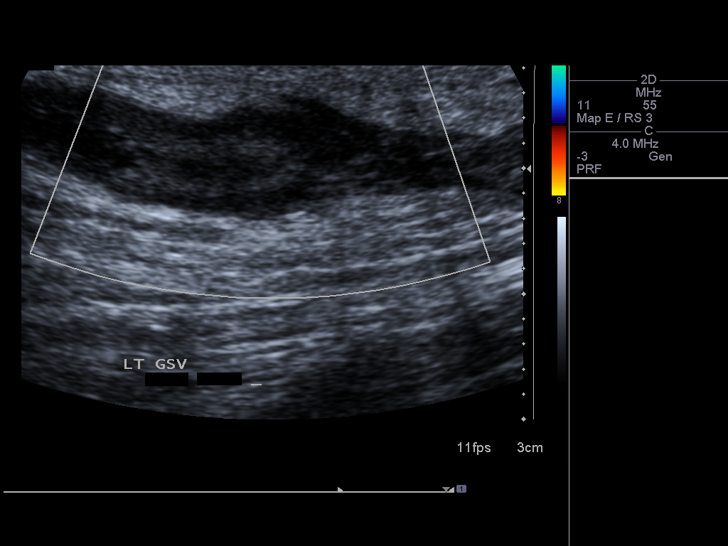
[im 35/35]
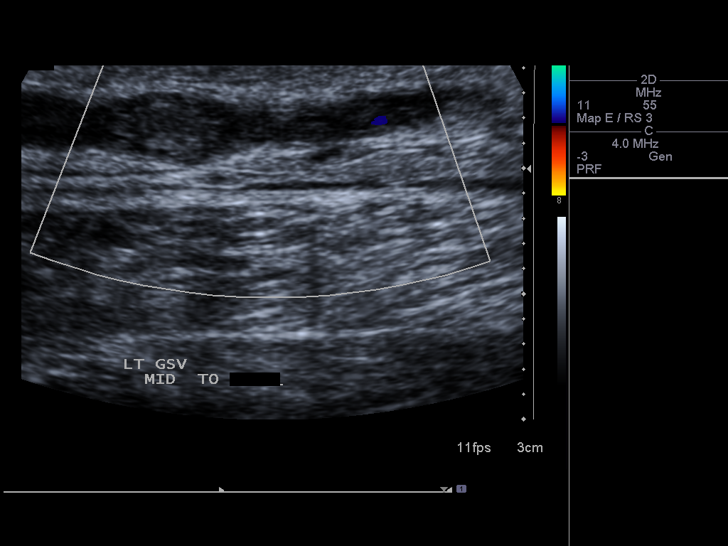

[14 of 24 positions shown; findings below may reference images not displayed]

FINDINGS: Deep veins of the left lower extremity are normally
patent.  Thrombus is identified in the superficial venous system
within the left great saphenous vein from roughly the level of the
knee to the mid calf.  This corresponds to the area of clinical
tenderness and skin erythema.  The great saphenous vein is also
enlarged and distended in this area which may imply underlying
venous insufficiency.
IMPRESSION: 1.  No evidence of deep venous thrombosis in the left lower
extremity.
2.  Thrombus in the great saphenous vein extending from the knee to
the mid calf level consistent with superficial thrombophlebitis.

## 2011-06-06 IMAGING — US US EXTREM LOW VENOUS*L*
1 series · 13 of 24 positions shown · non-contrast
Comparison: 09/02/2010.

CLINICAL DATA: Left leg pain and swelling.

LEFT LOWER EXTREMITY VENOUS DUPLEX ULTRASOUND
TECHNIQUE: Gray-scale sonography with graded compression, as well
as color Doppler and duplex ultrasound, were performed to evaluate
the deep venous system of the lower extremity from the level of the
common femoral vein through the popliteal and proximal calf veins.
Spectral Doppler was utilized to evaluate flow at rest and with
distal augmentation maneuvers.

[Series 1: us extrem low venous*left* · 13 of 40 slices shown]
[im 1/40]
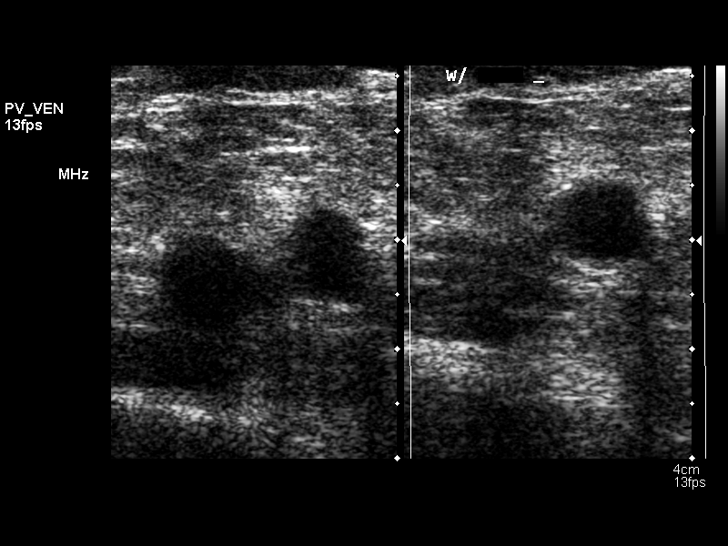
[im 4/40]
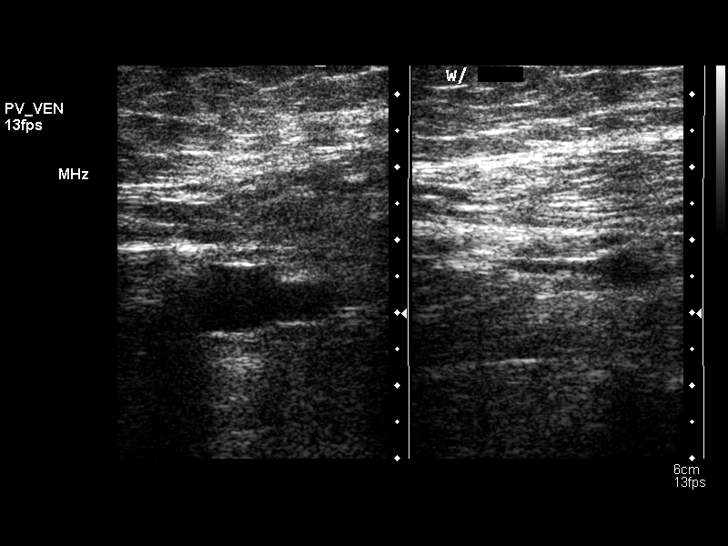
[im 7/40]
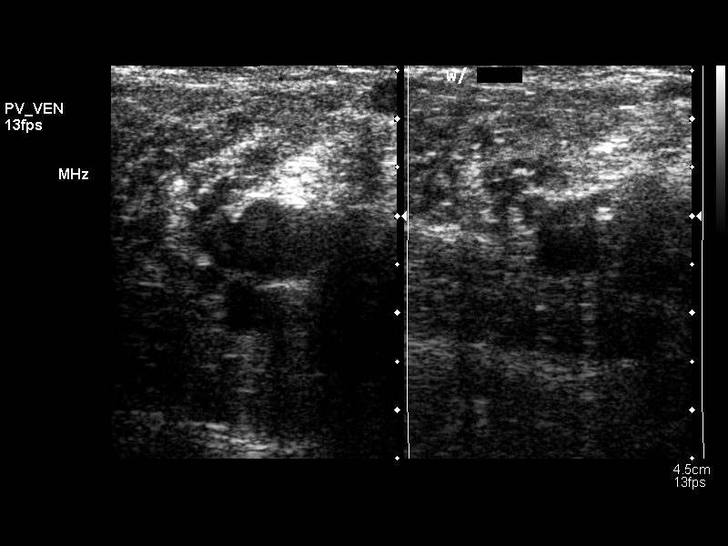
[im 11/40]
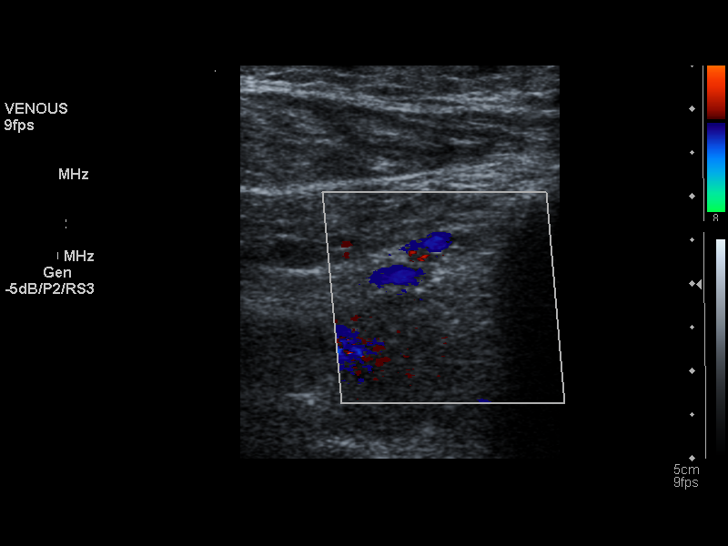
[im 14/40]
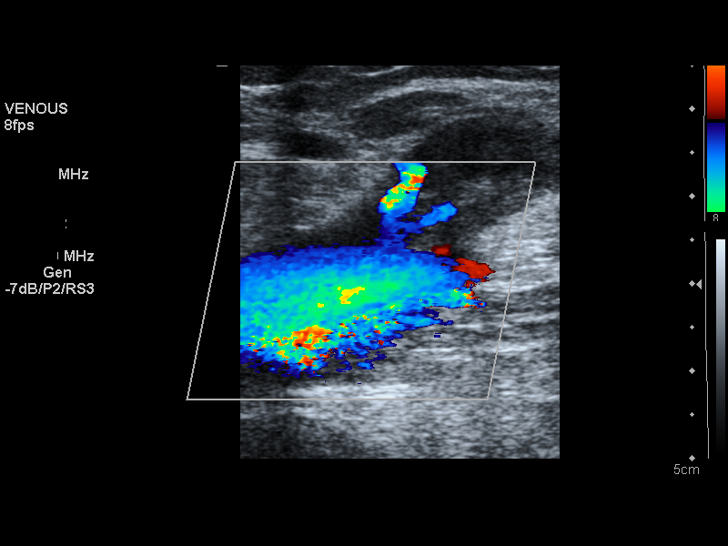
[im 17/40]
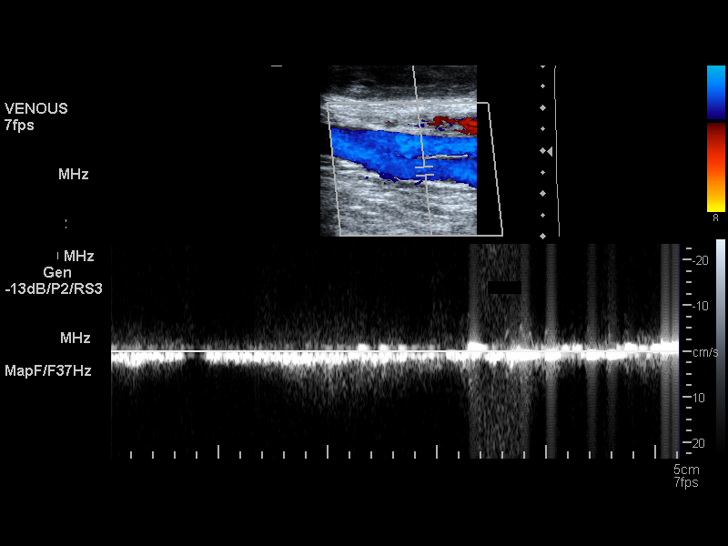
[im 21/40]
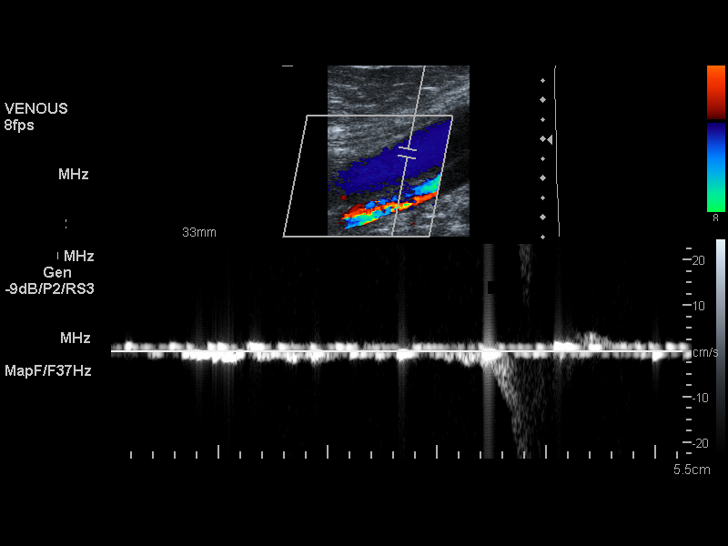
[im 23/40]
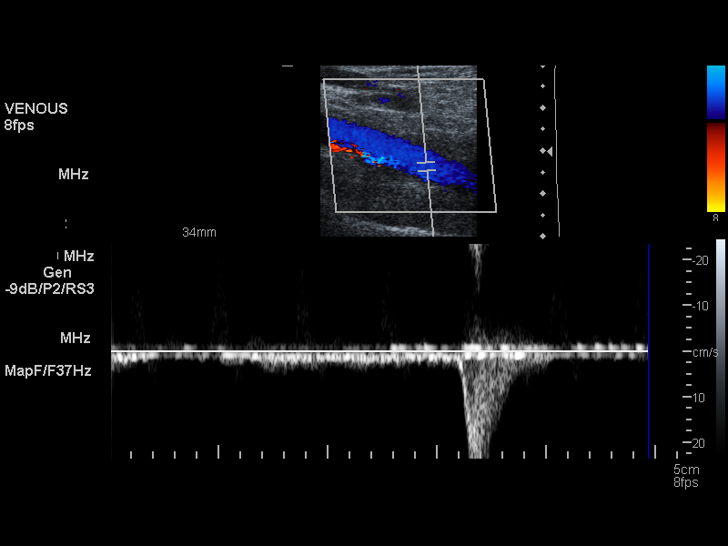
[im 26/40]
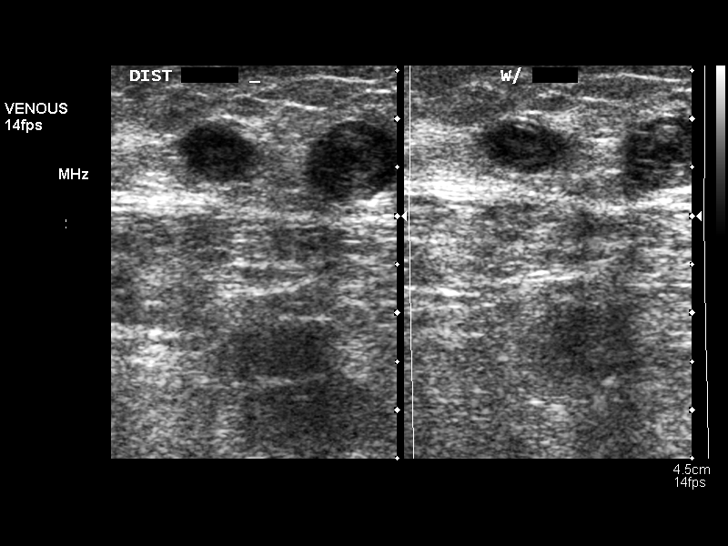
[im 29/40]
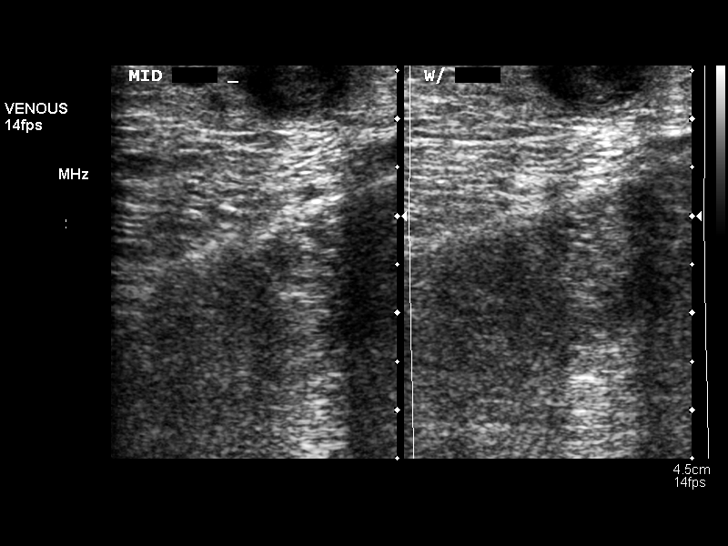
[im 33/40]
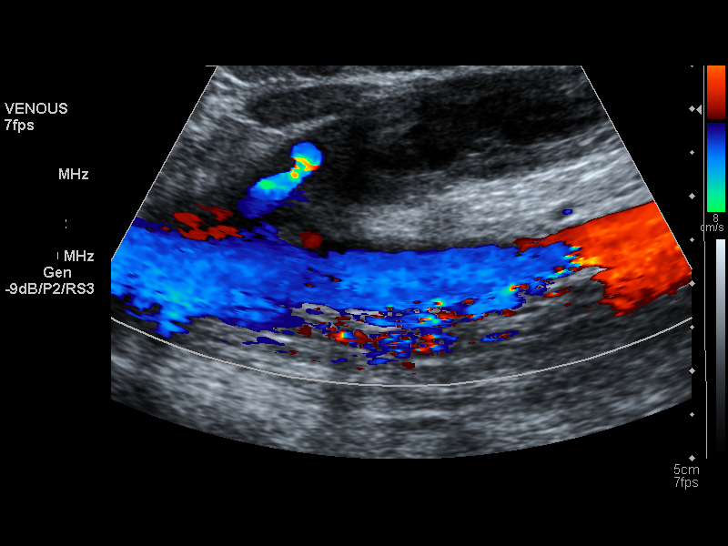
[im 36/40]
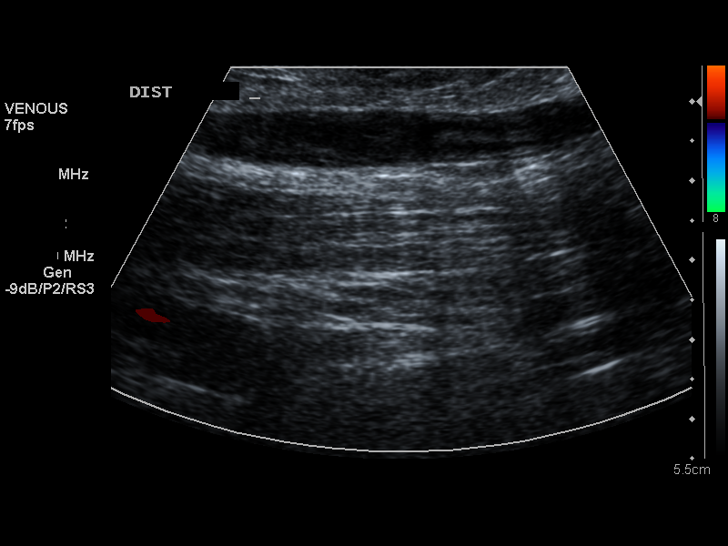
[im 40/40]
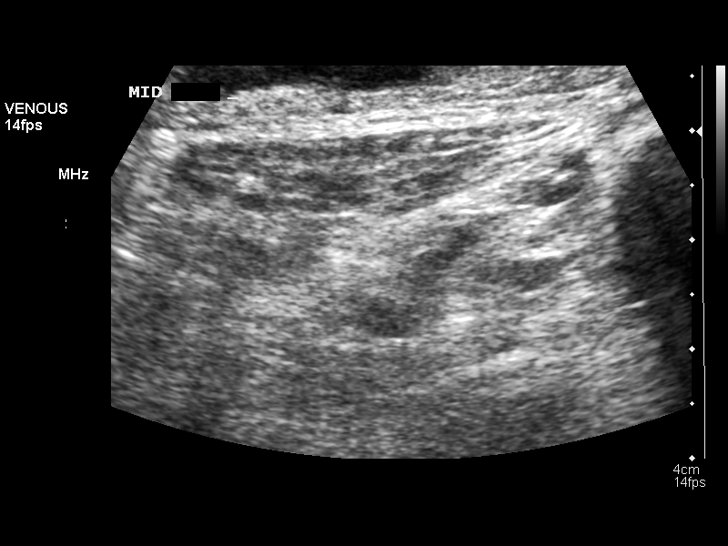

[13 of 24 positions shown; findings below may reference images not displayed]

FINDINGS: From the level of the left common femoral vein to the
left popliteal vein, there is adequate color flow, compression and
augmentation without evidence of a left lower extremity deep venous
thrombosis.  The visualized left calf veins are patent.

Interval proximal propagation of thrombus of the greater saphenous
vein.  This reaches saphenous femoral junction.  With further
proximal propagation this would involve the deep system and
therefore requires close ultrasound surveillance.
IMPRESSION: Presently no left lower extremity deep venous thrombosis however,
interval proximal propagation of thrombus of the greater saphenous
vein.  This reaches saphenous femoral junction.  With further
proximal propagation this would involve the deep system and
therefore requires close ultrasound surveillance.

## 2011-06-13 IMAGING — US US EXTREM LOW VENOUS*L*
1 series · 13 of 24 positions shown · non-contrast
Comparison: 09/25/2010

CLINICAL DATA: Follow up superficial thrombophlebitis.

LEFT LOWER EXTREMITY VENOUS DUPLEX ULTRASOUND
TECHNIQUE: Gray-scale sonography with graded compression, as well
as color Doppler and duplex ultrasound were performed to evaluate
the deep venous system of the lower extremity from the level of the
common femoral vein through the popliteal and proximal calf veins.
Spectral Doppler was utilized to evaluate flow at rest and with
distal augmentation maneuvers.

[Series 1: us extrem low venous*left* · 13 of 28 slices shown]
[im 1/28]
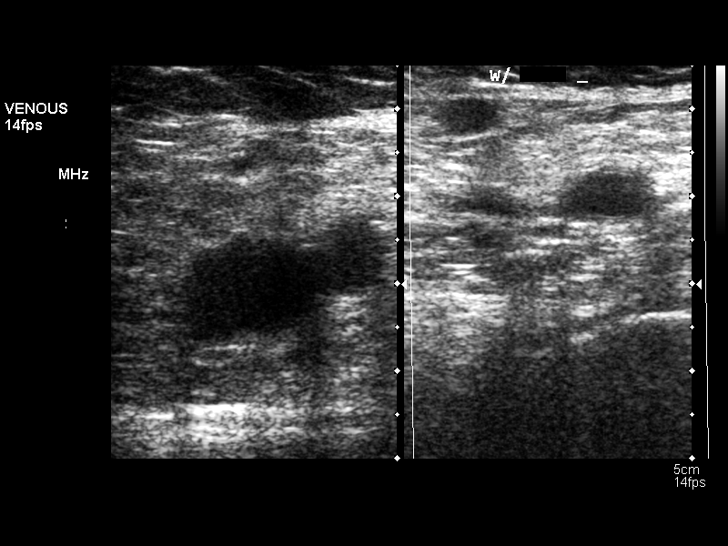
[im 3/28]
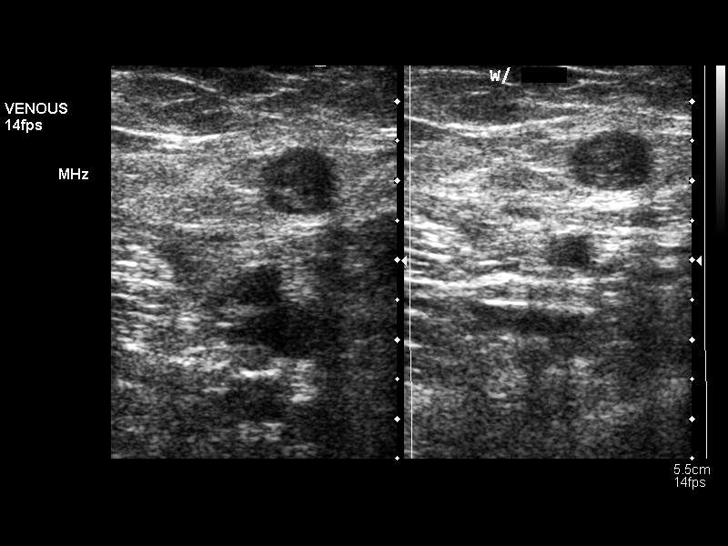
[im 5/28]
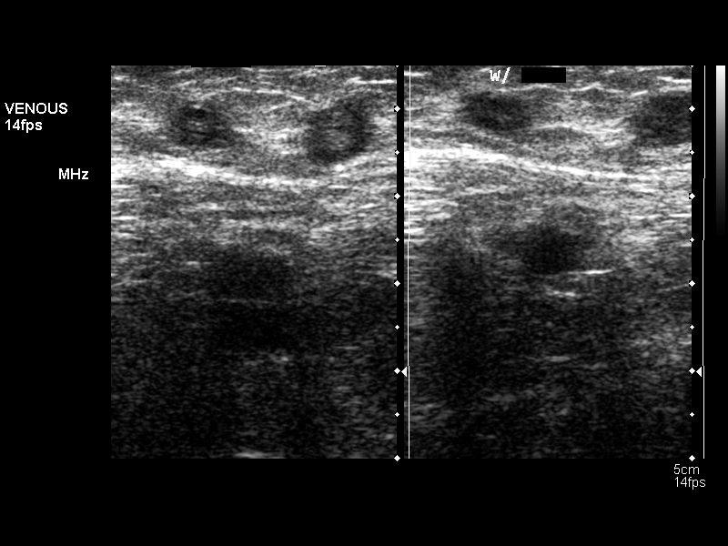
[im 8/28]
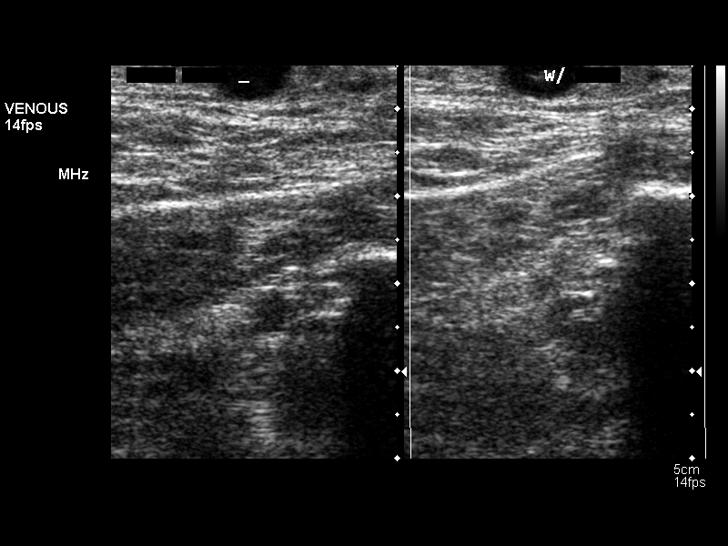
[im 10/28]
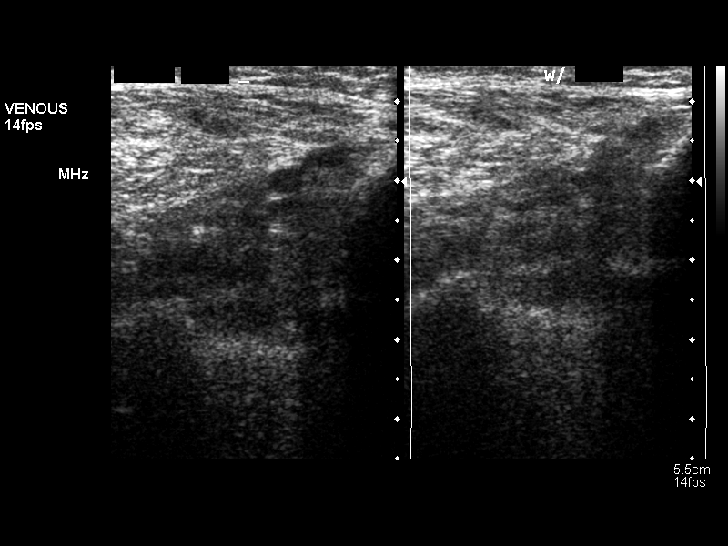
[im 12/28]
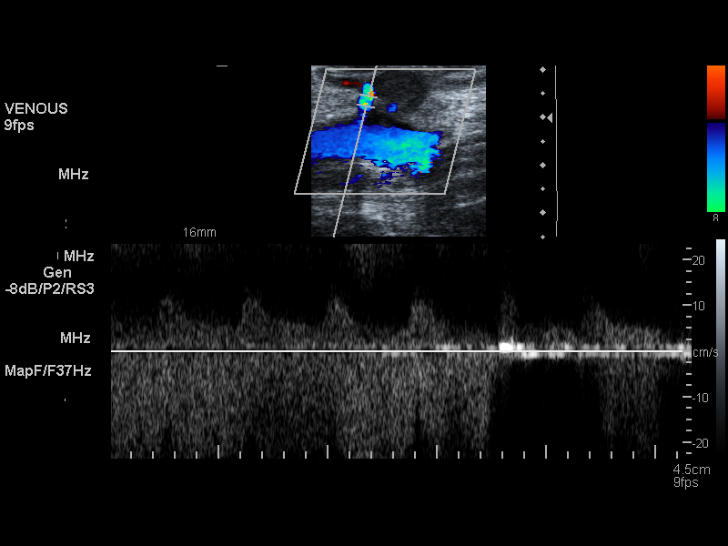
[im 15/28]
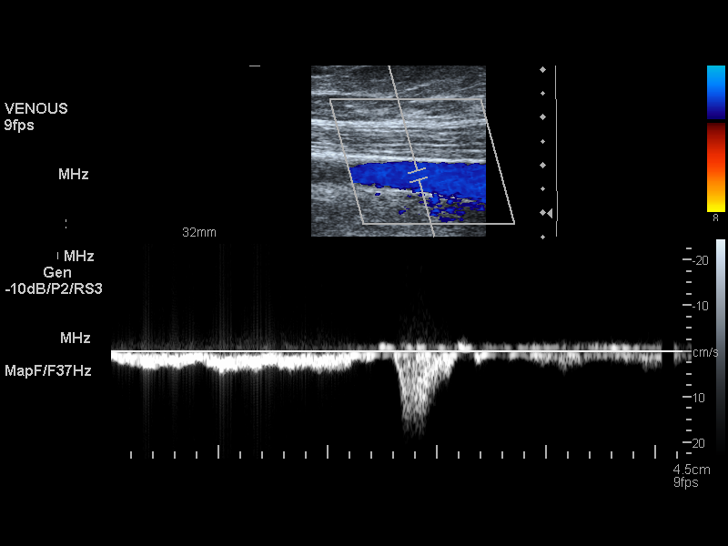
[im 16/28]
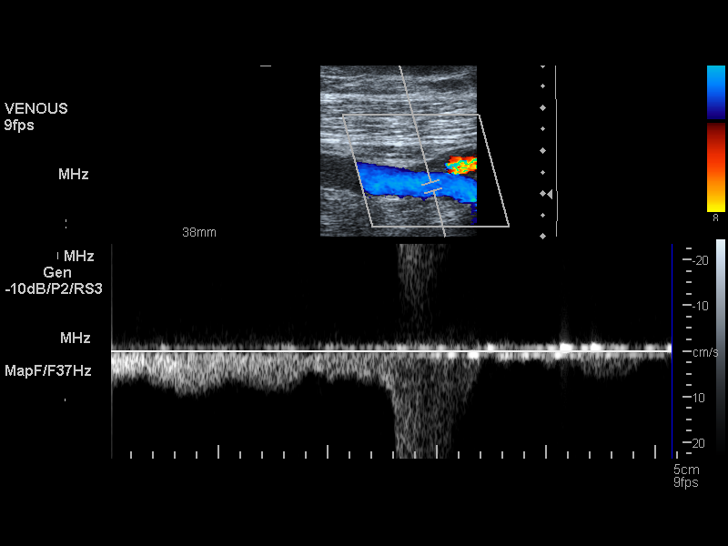
[im 18/28]
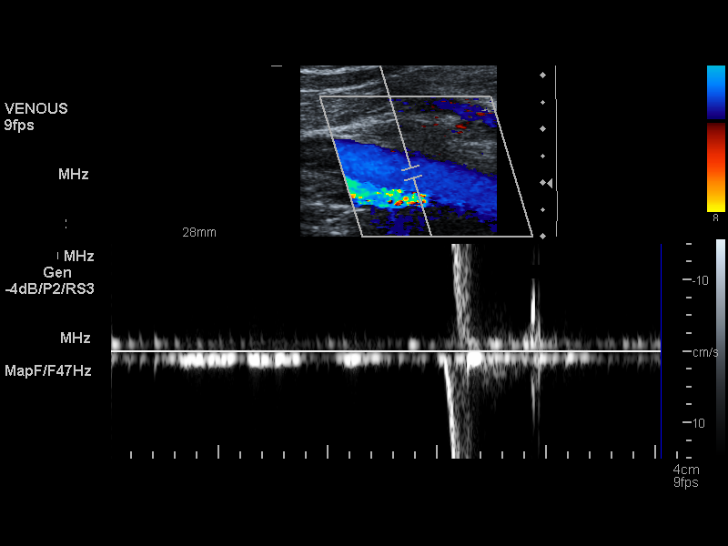
[im 20/28]
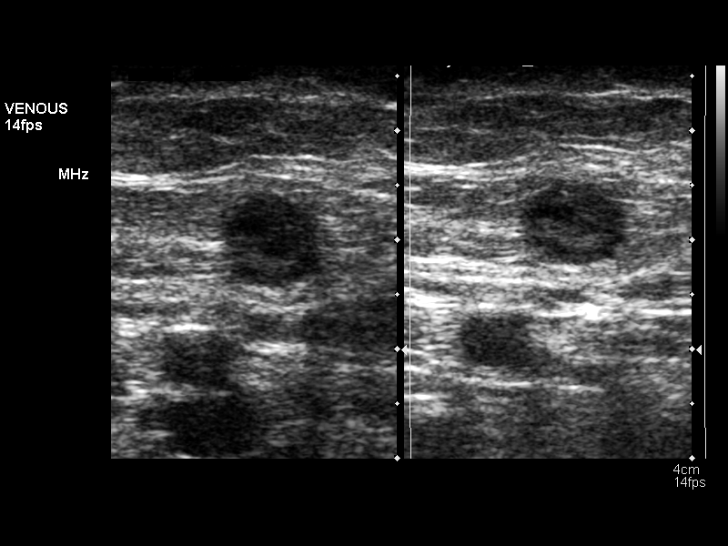
[im 23/28]
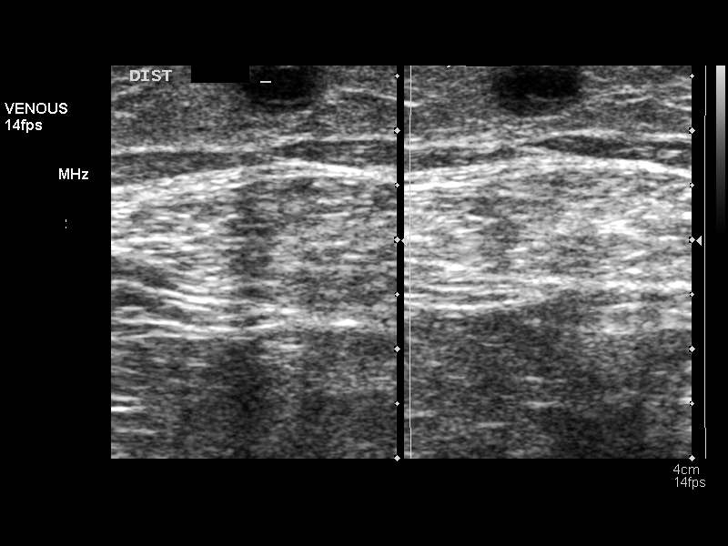
[im 25/28]
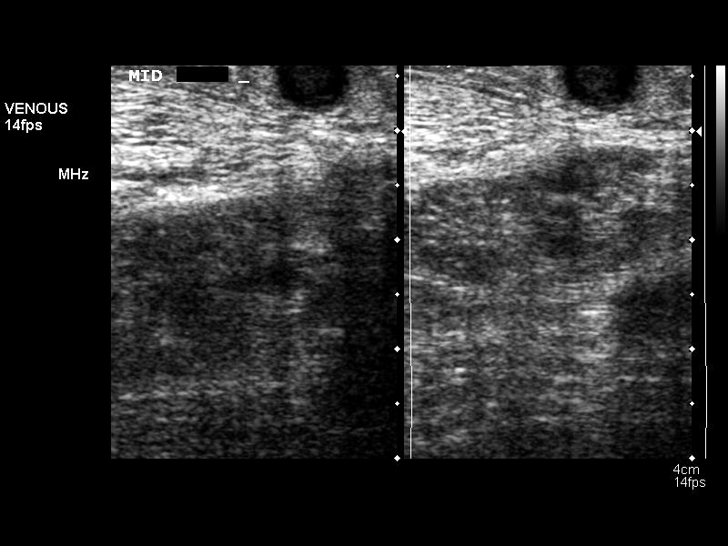
[im 28/28]
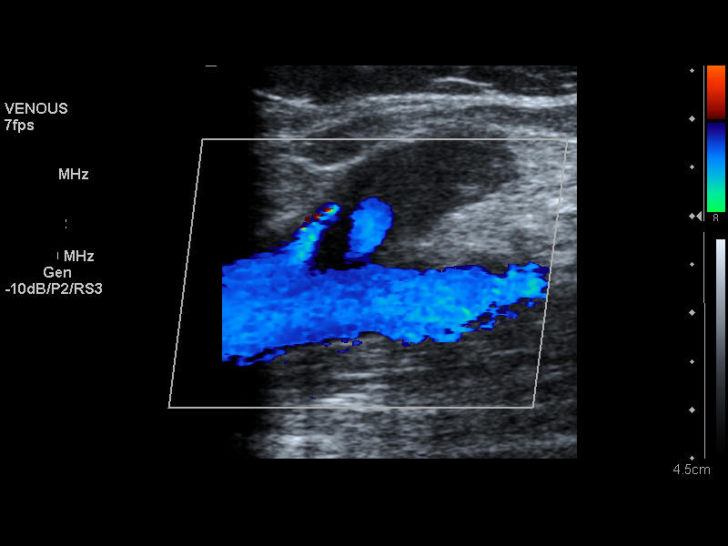

[13 of 24 positions shown; findings below may reference images not displayed]

FINDINGS: The left great saphenous vein is thrombosed from the
distal calf to the saphenofemoral junction.  There is no extension
into the common femoral vein.  There is some flow from the inferior
epigastric vein.

Normal compressibility of the common femoral, superficial femoral,
and popliteal veins is demonstrated, as well as the visualized
proximal calf veins.  No filling defects to suggest DVT on
grayscale or color Doppler imaging.  Doppler waveforms show normal
direction of venous flow, normal respiratory phasicity and response
to augmentation.
IMPRESSION: Thrombosis of the left great saphenous vein from the saphenofemoral
junction to the distal calf. The extent of the superficial
thrombosis is unchanged since 09/25/2010 and does not extend into
the deep venous system.  However, the thrombus remains at the
saphenofemoral junction, which is the junction between the
superficial and deep venous system.

Negative for left lower extremity DVT.

## 2011-06-24 ENCOUNTER — Other Ambulatory Visit: Payer: Self-pay | Admitting: Family Medicine

## 2011-06-24 DIAGNOSIS — N6489 Other specified disorders of breast: Secondary | ICD-10-CM

## 2011-08-07 ENCOUNTER — Ambulatory Visit
Admission: RE | Admit: 2011-08-07 | Discharge: 2011-08-07 | Disposition: A | Discharge status: Home / Self Care | Payer: BC Managed Care – PPO | Source: Ambulatory Visit | Attending: Family Medicine | Admitting: Family Medicine

## 2011-08-07 DIAGNOSIS — N6489 Other specified disorders of breast: Secondary | ICD-10-CM

## 2011-11-18 ENCOUNTER — Other Ambulatory Visit: Payer: Self-pay | Admitting: Family Medicine

## 2011-11-18 DIAGNOSIS — B0803 Pseudocowpox [milker's node]: Secondary | ICD-10-CM

## 2011-11-24 ENCOUNTER — Ambulatory Visit
Admission: RE | Admit: 2011-11-24 | Discharge: 2011-11-24 | Disposition: A | Discharge status: Home / Self Care | Payer: BC Managed Care – PPO | Source: Ambulatory Visit | Attending: Family Medicine | Admitting: Family Medicine

## 2011-11-24 DIAGNOSIS — B0803 Pseudocowpox [milker's node]: Secondary | ICD-10-CM

## 2012-07-18 ENCOUNTER — Other Ambulatory Visit: Payer: Self-pay | Admitting: Family Medicine

## 2012-07-18 DIAGNOSIS — Z1231 Encounter for screening mammogram for malignant neoplasm of breast: Secondary | ICD-10-CM

## 2012-08-22 ENCOUNTER — Ambulatory Visit
Admission: RE | Admit: 2012-08-22 | Discharge: 2012-08-22 | Disposition: A | Discharge status: Home / Self Care | Payer: BC Managed Care – PPO | Source: Ambulatory Visit | Attending: Family Medicine | Admitting: Family Medicine

## 2012-08-22 DIAGNOSIS — Z1231 Encounter for screening mammogram for malignant neoplasm of breast: Secondary | ICD-10-CM

## 2012-09-06 ENCOUNTER — Other Ambulatory Visit (HOSPITAL_COMMUNITY)
Admission: RE | Admit: 2012-09-06 | Discharge: 2012-09-06 | Disposition: A | Discharge status: Home / Self Care | Payer: BC Managed Care – PPO | Source: Ambulatory Visit | Attending: Family Medicine | Admitting: Family Medicine

## 2012-09-06 ENCOUNTER — Other Ambulatory Visit: Payer: Self-pay | Admitting: Family Medicine

## 2012-09-06 DIAGNOSIS — Z0000 Encounter for general adult medical examination without abnormal findings: Secondary | ICD-10-CM | POA: Insufficient documentation

## 2013-04-07 ENCOUNTER — Ambulatory Visit
Admission: RE | Admit: 2013-04-07 | Discharge: 2013-04-07 | Disposition: A | Discharge status: Home / Self Care | Payer: BC Managed Care – PPO | Source: Ambulatory Visit | Attending: Family Medicine | Admitting: Family Medicine

## 2013-04-07 ENCOUNTER — Other Ambulatory Visit: Payer: Self-pay | Admitting: Family Medicine

## 2013-04-07 DIAGNOSIS — J209 Acute bronchitis, unspecified: Secondary | ICD-10-CM

## 2013-08-14 ENCOUNTER — Other Ambulatory Visit: Payer: Self-pay

## 2013-08-14 DIAGNOSIS — Z1231 Encounter for screening mammogram for malignant neoplasm of breast: Secondary | ICD-10-CM

## 2013-09-04 ENCOUNTER — Ambulatory Visit: Payer: BC Managed Care – PPO

## 2013-09-25 ENCOUNTER — Ambulatory Visit
Admission: RE | Admit: 2013-09-25 | Discharge: 2013-09-25 | Disposition: A | Discharge status: Home / Self Care | Payer: BC Managed Care – PPO | Source: Ambulatory Visit

## 2013-09-25 DIAGNOSIS — Z1231 Encounter for screening mammogram for malignant neoplasm of breast: Secondary | ICD-10-CM

## 2013-10-03 ENCOUNTER — Ambulatory Visit
Admission: RE | Admit: 2013-10-03 | Discharge: 2013-10-03 | Disposition: A | Discharge status: Home / Self Care | Payer: BC Managed Care – PPO | Source: Ambulatory Visit | Attending: Family Medicine | Admitting: Family Medicine

## 2013-10-03 ENCOUNTER — Other Ambulatory Visit: Payer: Self-pay | Admitting: Family Medicine

## 2013-10-03 DIAGNOSIS — R609 Edema, unspecified: Secondary | ICD-10-CM

## 2013-11-28 ENCOUNTER — Other Ambulatory Visit: Payer: Self-pay | Admitting: *Deleted

## 2013-11-28 ENCOUNTER — Encounter: Payer: Self-pay | Admitting: Vascular Surgery

## 2013-11-28 DIAGNOSIS — I809 Phlebitis and thrombophlebitis of unspecified site: Secondary | ICD-10-CM

## 2013-11-29 ENCOUNTER — Ambulatory Visit (INDEPENDENT_AMBULATORY_CARE_PROVIDER_SITE_OTHER): Payer: BC Managed Care – PPO | Admitting: Vascular Surgery

## 2013-11-29 ENCOUNTER — Ambulatory Visit (HOSPITAL_COMMUNITY)
Admission: RE | Admit: 2013-11-29 | Discharge: 2013-11-29 | Disposition: A | Discharge status: Home / Self Care | Payer: BC Managed Care – PPO | Source: Ambulatory Visit | Attending: Vascular Surgery | Admitting: Vascular Surgery

## 2013-11-29 ENCOUNTER — Encounter: Payer: Self-pay | Admitting: Vascular Surgery

## 2013-11-29 VITALS — BP 138/58 | HR 76 | Resp 18 | Ht 66.0 in | Wt 233.0 lb

## 2013-11-29 DIAGNOSIS — I83893 Varicose veins of bilateral lower extremities with other complications: Secondary | ICD-10-CM | POA: Insufficient documentation

## 2013-11-29 DIAGNOSIS — I809 Phlebitis and thrombophlebitis of unspecified site: Secondary | ICD-10-CM | POA: Insufficient documentation

## 2013-11-29 NOTE — Assessment & Plan Note (Signed)
This patient does have evidence of chronic venous insufficiency. We have discussed the importance of intermittent leg elevation and the proper positioning for this. She does wear compression stockings. I have given her a prescription for some new stockings as hers may need to be replaced. I wrote her for a prescription of thigh high stockings with a gradient of 20-30 mm mercury.  She does by exam have evidence of phlebitis in the medial right leg although this was not visualized on her duplex. Regardless I encouraged her to continue to use warm compresses leg elevation and compression. 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 If her symptoms worsen then I would consider placing her on po antibiotics. I'll be happy to see her back at any time if her symptoms progress.

## 2013-11-29 NOTE — Progress Notes (Signed)
[facility]  Patient name: Miranda Freeman MRN: [id number] DOB: 1960/12/12 Sex: female  REASON FOR CONSULT: Right leg pain.  HPI: Miranda Freeman is a 53 y.o. female 6 weeks ago developed pain in the medial aspect of her right leg. She thinks that perhaps she dropped something on this area but cannot remember exactly. She does have a history of varicose veins. 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 She she does state that she thinks she may have had a DVT in 2010 but cannot remember the details. She was on anticoagulation for approximately 2 months. He's had persistent pain in the medial right leg. She wears compression stockings at work which does help with her pain. She's had a previous liver transplant in [place] and does not take nonsteroidal anti-inflammatory meds. She does elevate her legs occasionally. She denies fever or chills.   Past Medical History  Diagnosis Date  . Liver transplanted     Done at [facility] in [address], [place].   Marland Kitchen Hernia 2006    incisional   . Liver transplanted   . Liver disease    Family History  Problem Relation Age of Onset  . Cancer Mother     Breast  . Cancer Father     Prostate   SOCIAL HISTORY: History  Substance Use Topics  . Smoking status: Never Smoker   . Smokeless tobacco: Not on file  . Alcohol Use: No   Allergies  Allergen Reactions  . Fish Allergy Hives and Itching   Current Outpatient Prescriptions  Medication Sig Dispense Refill  . Levothyroxine Sodium 150 MCG CAPS Take by mouth.        . mycophenolate (CELLCEPT) 500 MG tablet Take by mouth 2 (two) times daily.        . Tacrolimus (PROGRAF PO) Take by mouth.         No current facility-administered medications for this visit.   REVIEW OF SYSTEMS: Valu.Nieves ] denotes positive finding; [  ] denotes negative finding  CARDIOVASCULAR:  [ ]  chest pain   [ ]  chest pressure   [ ]  palpitations   [ ]  orthopnea   [ ]  dyspnea on exertion   [ ]   claudication   [ ]  rest pain   [ ]  DVT   [ ]  phlebitis PULMONARY:   [ ]  productive cough   [ ]  asthma   [ ]  wheezing NEUROLOGIC:   [ ]  weakness  [ ]  paresthesias  [ ]  aphasia  [ ]  amaurosis  [ ]  dizziness HEMATOLOGIC:   [ ]  bleeding problems   [ ]  clotting disorders MUSCULOSKELETAL:  [ ]  joint pain   [ ]  joint swelling [ X] leg swelling GASTROINTESTINAL: [ ]   blood in stool  [ ]   hematemesis GENITOURINARY:  [ ]   dysuria  [ ]   hematuria PSYCHIATRIC:  [ ]  history of major depression INTEGUMENTARY:  [ ]  rashes  [ ]  ulcers CONSTITUTIONAL:  [ ]  fever   [ ]  chills  PHYSICAL EXAM: Filed Vitals:   11/29/13 1224  BP: 138/58  Pulse: 76  Resp: 18  Height: 5\' 6"  (1.676 m)  Weight: 233 lb (105.688 kg)  SpO2: 99%   Body mass index is 37.63 kg/(m^2). GENERAL: The patient is a well-nourished female, in no acute distress. The vital signs are documented above. CARDIOVASCULAR: There is a regular rate and rhythm. I do not detect carotid bruits. She has palpable pedal  pulses. She has mild bilateral lower extremity swelling. PULMONARY: There is good air exchange bilaterally without wheezing or rales. ABDOMEN: Soft and non-tender with normal pitched bowel sounds.  MUSCULOSKELETAL: There are no major deformities or cyanosis. NEUROLOGIC: No focal weakness or paresthesias are detected. SKIN: she does have hyperpigmentation bilaterally consistent with chronic venous insufficiency. There is an area of induration with no significant erythema over the distal aspect of her medial right leg. She has sometimes ectatic veins and spider veins bilaterally. She has some larger varicosities along the lateral aspect of her left thigh and some smaller veins on her anterior right thigh. PSYCHIATRIC: The patient has a normal affect.  DATA:  I have independently interpreted her venous duplex scan which shows no evidence of DVT. She does have some deep vein reflux of the popliteal vein and femoral vein. She has some reflux in  several segments of her greater saphenous vein the vein is not especially dilated. There was no obvious superficial thrombophlebitis noted.  Reviewed her duplex scan which was done on 10/03/2013 and showed no evidence of DVT.  MEDICAL ISSUES:  Varicose veins of lower extremities with other complications This patient does have evidence of chronic venous insufficiency. We have discussed the importance of intermittent leg elevation and the proper positioning for this. She does wear compression stockings. I have given her a prescription for some new stockings as hers may need to be replaced. I wrote her for a prescription of thigh high stockings with a gradient of 20-30 mm mercury.  She does by exam have evidence of phlebitis in the medial right leg although this was not visualized on her duplex. Regardless I encouraged her to continue to use warm compresses leg elevation and compression. 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 If her symptoms worsen then I would consider placing her on po antibiotics. I'll be happy to see her back at any time if her symptoms progress.   Angelia Mould [facility] [facility]: [phone]

## 2014-08-31 ENCOUNTER — Other Ambulatory Visit: Payer: Self-pay

## 2014-08-31 DIAGNOSIS — Z1231 Encounter for screening mammogram for malignant neoplasm of breast: Secondary | ICD-10-CM

## 2014-10-08 ENCOUNTER — Ambulatory Visit: Payer: Self-pay

## 2014-10-15 ENCOUNTER — Ambulatory Visit
Admission: RE | Admit: 2014-10-15 | Discharge: 2014-10-15 | Disposition: A | Discharge status: Home / Self Care | Payer: PRIVATE HEALTH INSURANCE | Source: Ambulatory Visit

## 2014-10-15 DIAGNOSIS — Z1231 Encounter for screening mammogram for malignant neoplasm of breast: Secondary | ICD-10-CM

## 2014-10-16 ENCOUNTER — Other Ambulatory Visit: Payer: Self-pay | Admitting: Family Medicine

## 2014-10-16 DIAGNOSIS — R928 Other abnormal and inconclusive findings on diagnostic imaging of breast: Secondary | ICD-10-CM

## 2014-10-18 ENCOUNTER — Ambulatory Visit
Admission: RE | Admit: 2014-10-18 | Discharge: 2014-10-18 | Disposition: A | Discharge status: Home / Self Care | Payer: PRIVATE HEALTH INSURANCE | Source: Ambulatory Visit | Attending: Family Medicine | Admitting: Family Medicine

## 2014-10-18 DIAGNOSIS — R928 Other abnormal and inconclusive findings on diagnostic imaging of breast: Secondary | ICD-10-CM

## 2014-11-07 ENCOUNTER — Other Ambulatory Visit: Payer: Self-pay | Admitting: Dermatology

## 2015-10-07 ENCOUNTER — Other Ambulatory Visit: Payer: Self-pay

## 2015-10-07 DIAGNOSIS — Z1231 Encounter for screening mammogram for malignant neoplasm of breast: Secondary | ICD-10-CM

## 2015-11-18 ENCOUNTER — Ambulatory Visit
Admission: RE | Admit: 2015-11-18 | Discharge: 2015-11-18 | Disposition: A | Discharge status: Home / Self Care | Payer: PRIVATE HEALTH INSURANCE | Source: Ambulatory Visit

## 2015-11-18 DIAGNOSIS — Z1231 Encounter for screening mammogram for malignant neoplasm of breast: Secondary | ICD-10-CM

## 2016-04-27 ENCOUNTER — Other Ambulatory Visit (HOSPITAL_COMMUNITY)
Admission: RE | Admit: 2016-04-27 | Discharge: 2016-04-27 | Disposition: A | Discharge status: Home / Self Care | Payer: PRIVATE HEALTH INSURANCE | Source: Ambulatory Visit | Attending: Family Medicine | Admitting: Family Medicine

## 2016-04-27 ENCOUNTER — Other Ambulatory Visit: Payer: Self-pay | Admitting: Family Medicine

## 2016-04-27 DIAGNOSIS — Z1151 Encounter for screening for human papillomavirus (HPV): Secondary | ICD-10-CM | POA: Insufficient documentation

## 2016-04-27 DIAGNOSIS — Z124 Encounter for screening for malignant neoplasm of cervix: Secondary | ICD-10-CM | POA: Insufficient documentation

## 2016-04-28 LAB — CYTOLOGY - PAP
DIAGNOSIS: NEGATIVE
HPV: NOT DETECTED

## 2016-08-25 DIAGNOSIS — D225 Melanocytic nevi of trunk: Secondary | ICD-10-CM | POA: Diagnosis not present

## 2016-08-25 DIAGNOSIS — L821 Other seborrheic keratosis: Secondary | ICD-10-CM | POA: Diagnosis not present

## 2016-08-25 DIAGNOSIS — L814 Other melanin hyperpigmentation: Secondary | ICD-10-CM | POA: Diagnosis not present

## 2016-09-21 DIAGNOSIS — J029 Acute pharyngitis, unspecified: Secondary | ICD-10-CM | POA: Diagnosis not present

## 2016-09-21 DIAGNOSIS — H6691 Otitis media, unspecified, right ear: Secondary | ICD-10-CM | POA: Diagnosis not present

## 2016-09-23 DIAGNOSIS — Z944 Liver transplant status: Secondary | ICD-10-CM | POA: Diagnosis not present

## 2016-09-23 DIAGNOSIS — Z79899 Other long term (current) drug therapy: Secondary | ICD-10-CM | POA: Diagnosis not present

## 2016-09-23 DIAGNOSIS — Z5189 Encounter for other specified aftercare: Secondary | ICD-10-CM | POA: Diagnosis not present

## 2016-09-24 DIAGNOSIS — H6980 Other specified disorders of Eustachian tube, unspecified ear: Secondary | ICD-10-CM | POA: Diagnosis not present

## 2016-09-24 DIAGNOSIS — J019 Acute sinusitis, unspecified: Secondary | ICD-10-CM | POA: Diagnosis not present

## 2016-10-19 ENCOUNTER — Other Ambulatory Visit: Payer: Self-pay | Admitting: Family Medicine

## 2016-10-19 DIAGNOSIS — Z1231 Encounter for screening mammogram for malignant neoplasm of breast: Secondary | ICD-10-CM

## 2016-10-26 DIAGNOSIS — Z944 Liver transplant status: Secondary | ICD-10-CM | POA: Diagnosis not present

## 2016-11-23 ENCOUNTER — Ambulatory Visit
Admission: RE | Admit: 2016-11-23 | Discharge: 2016-11-23 | Disposition: A | Discharge status: Home / Self Care | Payer: 59 | Source: Ambulatory Visit | Attending: Family Medicine | Admitting: Family Medicine

## 2016-11-23 DIAGNOSIS — Z1231 Encounter for screening mammogram for malignant neoplasm of breast: Secondary | ICD-10-CM | POA: Diagnosis not present

## 2017-01-29 DIAGNOSIS — Z5181 Encounter for therapeutic drug level monitoring: Secondary | ICD-10-CM | POA: Diagnosis not present

## 2017-01-29 DIAGNOSIS — Z944 Liver transplant status: Secondary | ICD-10-CM | POA: Diagnosis not present

## 2017-01-29 DIAGNOSIS — Z79899 Other long term (current) drug therapy: Secondary | ICD-10-CM | POA: Diagnosis not present

## 2017-05-13 DIAGNOSIS — Z23 Encounter for immunization: Secondary | ICD-10-CM | POA: Diagnosis not present

## 2017-06-17 DIAGNOSIS — Z944 Liver transplant status: Secondary | ICD-10-CM | POA: Diagnosis not present

## 2017-06-17 DIAGNOSIS — Z5181 Encounter for therapeutic drug level monitoring: Secondary | ICD-10-CM | POA: Diagnosis not present

## 2017-06-17 DIAGNOSIS — Z79899 Other long term (current) drug therapy: Secondary | ICD-10-CM | POA: Diagnosis not present

## 2017-07-20 DIAGNOSIS — Z23 Encounter for immunization: Secondary | ICD-10-CM | POA: Diagnosis not present

## 2017-07-20 DIAGNOSIS — E559 Vitamin D deficiency, unspecified: Secondary | ICD-10-CM | POA: Diagnosis not present

## 2017-07-20 DIAGNOSIS — Z1211 Encounter for screening for malignant neoplasm of colon: Secondary | ICD-10-CM | POA: Diagnosis not present

## 2017-07-20 DIAGNOSIS — Z13220 Encounter for screening for lipoid disorders: Secondary | ICD-10-CM | POA: Diagnosis not present

## 2017-07-20 DIAGNOSIS — E039 Hypothyroidism, unspecified: Secondary | ICD-10-CM | POA: Diagnosis not present

## 2017-07-20 DIAGNOSIS — Z0000 Encounter for general adult medical examination without abnormal findings: Secondary | ICD-10-CM | POA: Diagnosis not present

## 2017-07-21 ENCOUNTER — Other Ambulatory Visit: Payer: Self-pay | Admitting: Family Medicine

## 2017-07-22 ENCOUNTER — Other Ambulatory Visit: Payer: Self-pay | Admitting: Family Medicine

## 2017-07-22 DIAGNOSIS — M7989 Other specified soft tissue disorders: Secondary | ICD-10-CM

## 2017-07-30 ENCOUNTER — Other Ambulatory Visit: Payer: 59

## 2017-08-02 ENCOUNTER — Ambulatory Visit
Admission: RE | Admit: 2017-08-02 | Discharge: 2017-08-02 | Disposition: A | Discharge status: Home / Self Care | Payer: 59 | Source: Ambulatory Visit | Attending: Family Medicine | Admitting: Family Medicine

## 2017-08-02 DIAGNOSIS — R2232 Localized swelling, mass and lump, left upper limb: Secondary | ICD-10-CM | POA: Diagnosis not present

## 2017-08-02 DIAGNOSIS — M7989 Other specified soft tissue disorders: Secondary | ICD-10-CM

## 2017-09-23 DIAGNOSIS — Z5181 Encounter for therapeutic drug level monitoring: Secondary | ICD-10-CM | POA: Diagnosis not present

## 2017-09-23 DIAGNOSIS — Z944 Liver transplant status: Secondary | ICD-10-CM | POA: Diagnosis not present

## 2017-09-23 DIAGNOSIS — Z79899 Other long term (current) drug therapy: Secondary | ICD-10-CM | POA: Diagnosis not present

## 2017-10-14 DIAGNOSIS — B349 Viral infection, unspecified: Secondary | ICD-10-CM | POA: Diagnosis not present

## 2017-10-14 DIAGNOSIS — Z944 Liver transplant status: Secondary | ICD-10-CM | POA: Diagnosis not present

## 2017-10-14 DIAGNOSIS — R1013 Epigastric pain: Secondary | ICD-10-CM | POA: Diagnosis not present

## 2017-11-19 DIAGNOSIS — Z23 Encounter for immunization: Secondary | ICD-10-CM | POA: Diagnosis not present

## 2017-12-27 DIAGNOSIS — D225 Melanocytic nevi of trunk: Secondary | ICD-10-CM | POA: Diagnosis not present

## 2017-12-27 DIAGNOSIS — L814 Other melanin hyperpigmentation: Secondary | ICD-10-CM | POA: Diagnosis not present

## 2017-12-27 DIAGNOSIS — L821 Other seborrheic keratosis: Secondary | ICD-10-CM | POA: Diagnosis not present

## 2018-01-03 ENCOUNTER — Other Ambulatory Visit: Payer: Self-pay | Admitting: Family Medicine

## 2018-01-03 DIAGNOSIS — Z1231 Encounter for screening mammogram for malignant neoplasm of breast: Secondary | ICD-10-CM

## 2018-01-26 DIAGNOSIS — Z79899 Other long term (current) drug therapy: Secondary | ICD-10-CM | POA: Diagnosis not present

## 2018-01-26 DIAGNOSIS — Z944 Liver transplant status: Secondary | ICD-10-CM | POA: Diagnosis not present

## 2018-01-26 DIAGNOSIS — Z5181 Encounter for therapeutic drug level monitoring: Secondary | ICD-10-CM | POA: Diagnosis not present

## 2018-01-31 ENCOUNTER — Ambulatory Visit
Admission: RE | Admit: 2018-01-31 | Discharge: 2018-01-31 | Disposition: A | Discharge status: Home / Self Care | Payer: 59 | Source: Ambulatory Visit | Attending: Family Medicine | Admitting: Family Medicine

## 2018-01-31 DIAGNOSIS — Z1231 Encounter for screening mammogram for malignant neoplasm of breast: Secondary | ICD-10-CM

## 2018-05-26 DIAGNOSIS — J209 Acute bronchitis, unspecified: Secondary | ICD-10-CM | POA: Diagnosis not present

## 2018-05-31 DIAGNOSIS — Z5181 Encounter for therapeutic drug level monitoring: Secondary | ICD-10-CM | POA: Diagnosis not present

## 2018-05-31 DIAGNOSIS — Z79899 Other long term (current) drug therapy: Secondary | ICD-10-CM | POA: Diagnosis not present

## 2018-05-31 DIAGNOSIS — Z944 Liver transplant status: Secondary | ICD-10-CM | POA: Diagnosis not present

## 2018-06-03 DIAGNOSIS — J0100 Acute maxillary sinusitis, unspecified: Secondary | ICD-10-CM | POA: Diagnosis not present

## 2018-06-03 DIAGNOSIS — M7989 Other specified soft tissue disorders: Secondary | ICD-10-CM | POA: Diagnosis not present

## 2018-06-03 DIAGNOSIS — I82812 Embolism and thrombosis of superficial veins of left lower extremities: Secondary | ICD-10-CM | POA: Diagnosis not present

## 2018-06-06 DIAGNOSIS — I8002 Phlebitis and thrombophlebitis of superficial vessels of left lower extremity: Secondary | ICD-10-CM | POA: Diagnosis not present

## 2018-06-16 ENCOUNTER — Other Ambulatory Visit: Payer: Self-pay

## 2018-06-16 DIAGNOSIS — I809 Phlebitis and thrombophlebitis of unspecified site: Secondary | ICD-10-CM

## 2018-07-08 DIAGNOSIS — J069 Acute upper respiratory infection, unspecified: Secondary | ICD-10-CM | POA: Diagnosis not present

## 2018-07-22 DIAGNOSIS — Z0000 Encounter for general adult medical examination without abnormal findings: Secondary | ICD-10-CM | POA: Diagnosis not present

## 2018-07-22 DIAGNOSIS — N289 Disorder of kidney and ureter, unspecified: Secondary | ICD-10-CM | POA: Diagnosis not present

## 2018-07-27 DIAGNOSIS — Z1211 Encounter for screening for malignant neoplasm of colon: Secondary | ICD-10-CM | POA: Diagnosis not present

## 2018-08-24 ENCOUNTER — Encounter: Payer: Self-pay | Admitting: Vascular Surgery

## 2018-08-24 ENCOUNTER — Other Ambulatory Visit: Payer: Self-pay

## 2018-08-24 ENCOUNTER — Ambulatory Visit (HOSPITAL_COMMUNITY)
Admission: RE | Admit: 2018-08-24 | Discharge: 2018-08-24 | Disposition: A | Discharge status: Home / Self Care | Payer: 59 | Source: Ambulatory Visit | Attending: Family | Admitting: Family

## 2018-08-24 ENCOUNTER — Ambulatory Visit: Payer: 59 | Admitting: Vascular Surgery

## 2018-08-24 VITALS — BP 136/78 | HR 79 | Temp 97.1°F | Resp 20 | Ht 66.0 in | Wt 233.0 lb

## 2018-08-24 DIAGNOSIS — I8002 Phlebitis and thrombophlebitis of superficial vessels of left lower extremity: Secondary | ICD-10-CM | POA: Diagnosis not present

## 2018-08-24 DIAGNOSIS — I809 Phlebitis and thrombophlebitis of unspecified site: Secondary | ICD-10-CM | POA: Insufficient documentation

## 2018-08-24 NOTE — Progress Notes (Addendum)
REASON FOR CONSULT:    Superficial thrombophlebitis left leg.  The consult is requested by Dr. Harlan Stains.  HPI:   Miranda Freeman is a pleasant 58 y.o. female, who is referred with superficial thrombophlebitis of the left leg.  I reviewed the records that were sent from the referring office.  The patient was seen on 06/06/2018 with superficial thrombophlebitis of the left leg.  The patient was on Xarelto and the dose was increased at the time of that visit.  On my history, the patient had an episode of bronchitis in November 2019 and had some severe coughing she noted a pain in her medial left thigh and subsequently developed swelling in this area and discomfort.  Subsequent duplex scan showed evidence of superficial thrombophlebitis involving the left great saphenous vein in the mid thigh.  She was started initially on Xarelto but because of her history of a liver transplant was converted to Eliquis.  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  She has some aching pain and heaviness in her legs which is aggravated by standing and sitting and relieved with elevation.  Her phlebitis was treated with leg elevation warm compresses and ibuprofen as needed for pain.  Past Medical History:  Diagnosis Date  . Hernia 2006   incisional   . Liver disease   . Liver transplanted [facility])    Done at [facility] in [place], [place].   . Liver transplanted [facility])     Family History  Problem Relation Age of Onset  . Cancer Mother        Breast  . Breast cancer Mother 76  . Cancer Father        Prostate    SOCIAL HISTORY: Social History   Socioeconomic History  . Marital status: Single    Spouse name: Not on file  . Number of children: Not on file  . Years of education: Not on file  . Highest education level: Not on file  Occupational History  . Not on file  Social Needs  . Financial resource strain: Not on file  . Food insecurity:    Worry: Not on file    Inability:  Not on file  . Transportation needs:    Medical: Not on file    Non-medical: Not on file  Tobacco Use  . Smoking status: Never Smoker  Substance and Sexual Activity  . Alcohol use: No  . Drug use: No  . Sexual activity: Not on file  Lifestyle  . Physical activity:    Days per week: Not on file    Minutes per session: Not on file  . Stress: Not on file  Relationships  . Social connections:    Talks on phone: Not on file    Gets together: Not on file    Attends religious service: Not on file    Active member of club or organization: Not on file    Attends meetings of clubs or organizations: Not on file    Relationship status: Not on file  . Intimate partner violence:    Fear of current or ex partner: Not on file    Emotionally abused: Not on file    Physically abused: Not on file    Forced sexual activity: Not on file  Other Topics Concern  . Not on file  Social History Narrative  . Not on file    Allergies  Allergen Reactions  . Fish Allergy Hives and Itching  Current Outpatient Medications  Medication Sig Dispense Refill  . calcium gluconate 500 MG tablet Take 1 tablet by mouth 3 (three) times daily.    . Cyanocobalamin (VITAMIN B 12) 500 MCG TABS Take by mouth.    Arne Cleveland 5 MG TABS tablet     . Levothyroxine Sodium 150 MCG CAPS Take by mouth.      . Multiple Vitamin (MULTI-DAY PO) Take by mouth. Centrum    . mycophenolate (CELLCEPT) 500 MG tablet Take by mouth 2 (two) times daily.      . SUPER B COMPLEX/C PO Take by mouth.    . Tacrolimus (PROGRAF PO) Take by mouth.       No current facility-administered medications for this visit.     REVIEW OF SYSTEMS:  [X]  denotes positive finding, [ ]  denotes negative finding Cardiac  Comments:  Chest pain or chest pressure:    Shortness of breath upon exertion:    Short of breath when lying flat:    Irregular heart rhythm:        Vascular    Pain in calf, thigh, or hip brought on by ambulation:    Pain in feet  at night that wakes you up from your sleep:     Blood clot in your veins: x   Leg swelling:         Pulmonary    Oxygen at home:    Productive cough:     Wheezing:         Neurologic    Sudden weakness in arms or legs:     Sudden numbness in arms or legs:     Sudden onset of difficulty speaking or slurred speech:    Temporary loss of vision in one eye:     Problems with dizziness:         Gastrointestinal    Blood in stool:     Vomited blood:         Genitourinary    Burning when urinating:     Blood in urine:        Psychiatric    Major depression:         Hematologic    Bleeding problems:    Problems with blood clotting too easily:        Skin    Rashes or ulcers:        Constitutional    Fever or chills:     PHYSICAL EXAM:   Vitals:   08/24/18 1324  BP: 136/78  Pulse: 79  Resp: 20  Temp: (!) 97.1 F (36.2 C)  SpO2: 98%  Weight: 233 lb (105.7 kg)  Height: 5\' 6"  (1.676 m)   GENERAL: The patient is a well-nourished female, in no acute distress. The vital signs are documented above. CARDIAC: There is a regular rate and rhythm.  VASCULAR: I do not detect carotid bruits. She has palpable pedal pulses bilaterally. VENOUS EXAM: She does have some hyperpigmentation bilaterally.  She has mild bilateral lower extremity swelling. She has some spider veins and reticular veins in both thighs and legs.  She does not have any large truncal varicosities on exam but does have some large varicose veins by SonoSite. I looked at her great saphenous vein myself with the SonoSite.  The proximal left great saphenous vein is dilated with reflux.  There is clot in the mid segment of the great saphenous vein in the left thigh.  She has some reflux below that. PULMONARY: There is good air  exchange bilaterally without wheezing or rales. ABDOMEN: Soft and non-tender with normal pitched bowel sounds.  MUSCULOSKELETAL: There are no major deformities or cyanosis. NEUROLOGIC: No focal  weakness or paresthesias are detected. SKIN: There are no ulcers or rashes noted. PSYCHIATRIC: The patient has a normal affect.  DATA:    VENOUS DUPLEX: I have independently interpreted the duplex of the left lower extremity.  This shows evidence of superficial thrombophlebitis involving the left great saphenous vein in the mid thigh and some varicose veins associated with this.  There is no evidence of DVT.  There is deep venous reflux involving the common femoral vein and popliteal vein.  There is reflux in the superficial system involving the great saphenous vein in the distal thigh and mid thigh.   ASSESSMENT & PLAN:   SUPERFICIAL THROMBOPHLEBITIS LEFT LEG: This patient has superficial thrombophlebitis involving the great saphenous vein in the mid thigh and also some varicose veins in this area.  She does have reflux in the great saphenous vein but given that the vein is occluded in the mid thigh currently she is not a candidate for laser ablation of the left great saphenous vein.  We have discussed with her conservative treatment.  I have encouraged her to elevate her legs daily and we have discussed the proper positioning for this.  In addition I have written her a prescription for knee-high compression stockings with a gradient of 15 to 20 mmHg.  I have encouraged her to avoid prolonged sitting and standing.  We have discussed the importance of exercise specifically walking and water aerobics.  We also discussed the importance of careful weight management.  She understands that venous disease can be progressive and certainly first symptoms or varicose veins progress in the future we could reevaluate her with a reflux study.  If her symptoms progress we could potentially put her in thigh-high stockings with a tiger gradient and if that were not successful she may potentially be a candidate for laser ablation of the left great saphenous vein in the future.  I will see her back as needed.  Of note  from my perspective she does not need to be on Eliquis.  I have instructed her to discontinue this.  Deitra Mayo [facility] [phone]

## 2018-09-26 DIAGNOSIS — Z79899 Other long term (current) drug therapy: Secondary | ICD-10-CM | POA: Diagnosis not present

## 2018-09-26 DIAGNOSIS — Z5181 Encounter for therapeutic drug level monitoring: Secondary | ICD-10-CM | POA: Diagnosis not present

## 2018-09-26 DIAGNOSIS — Z944 Liver transplant status: Secondary | ICD-10-CM | POA: Diagnosis not present

## 2018-10-12 IMAGING — MG DIGITAL SCREENING BILATERAL MAMMOGRAM WITH TOMO AND CAD
6 of 10 series · 6 of 30 positions shown · non-contrast
Comparison: Previous exam(s).

ACR Breast Density Category a: The breast tissue is almost entirely
fatty.

CLINICAL DATA: Screening.

EXAM:
DIGITAL SCREENING BILATERAL MAMMOGRAM WITH TOMO AND CAD

[R CC synth-2D (1 of 2)]
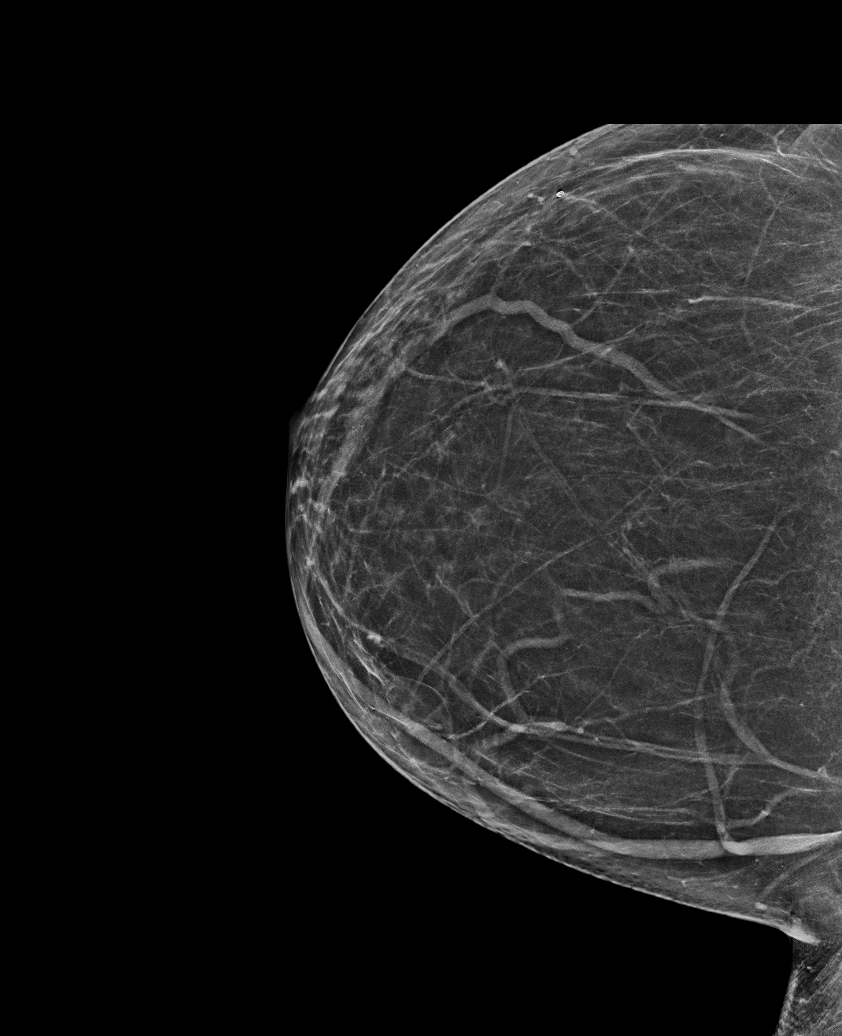

[L MLO synth-2D]
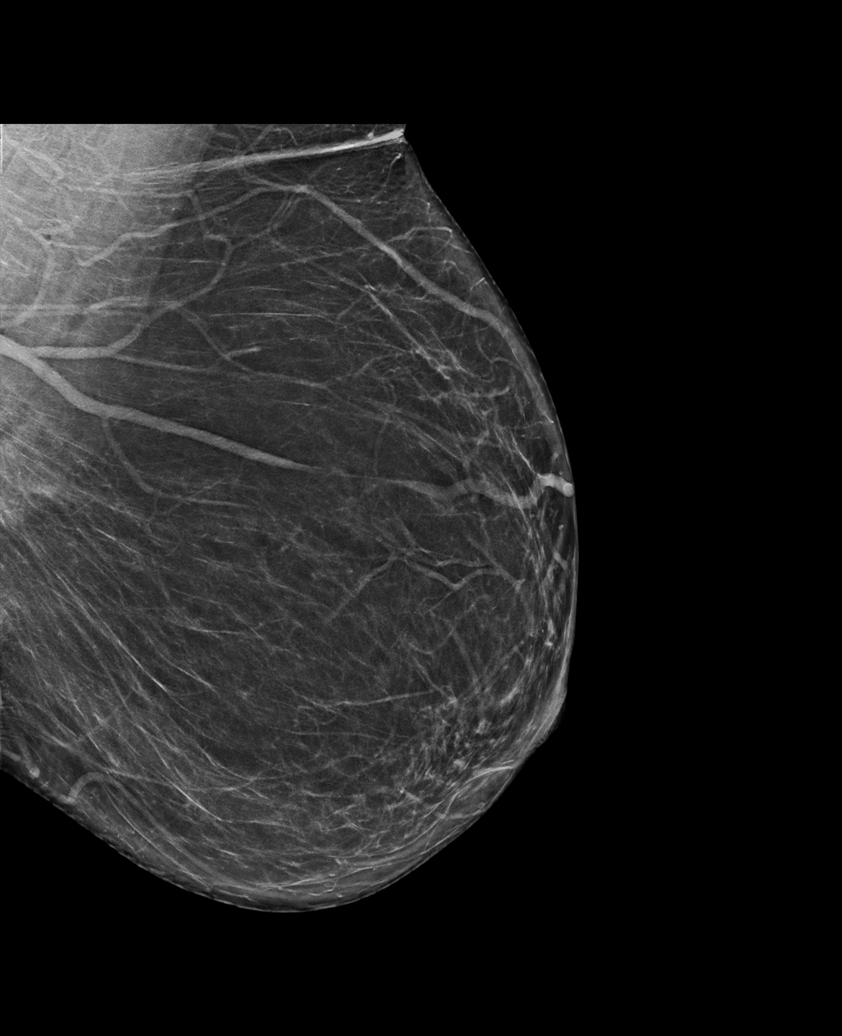

[L CC synth-2D]
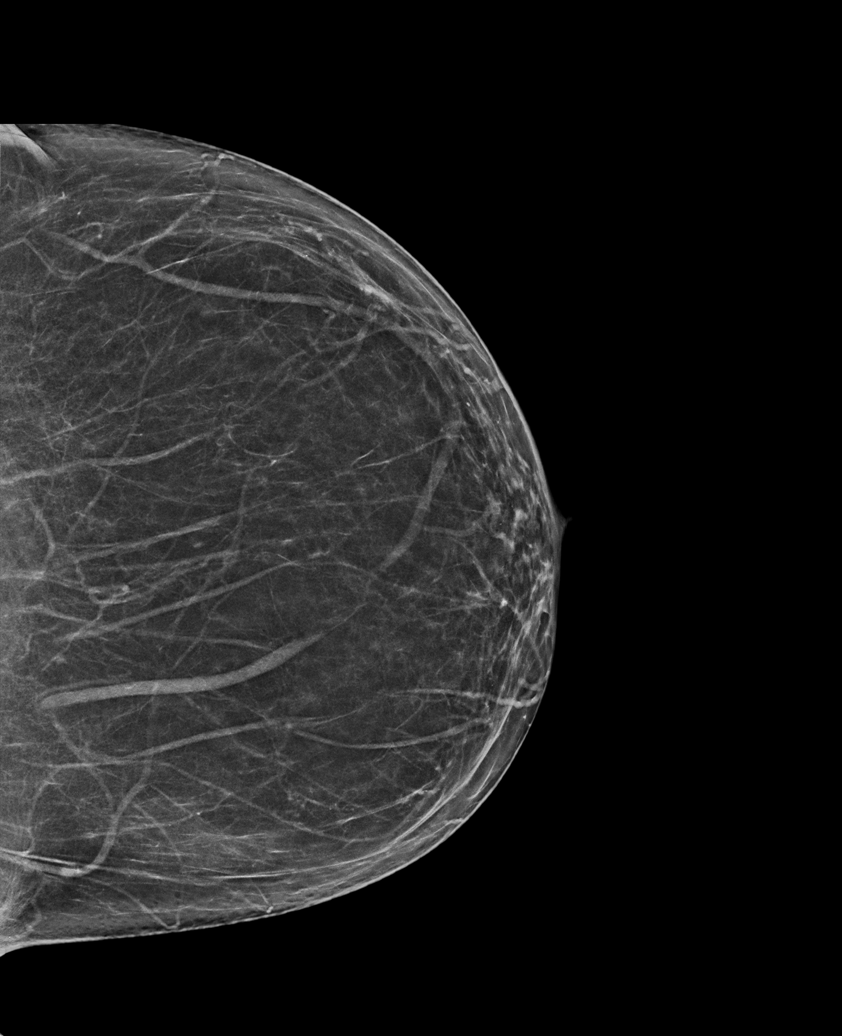

[R MLO synth-2D]
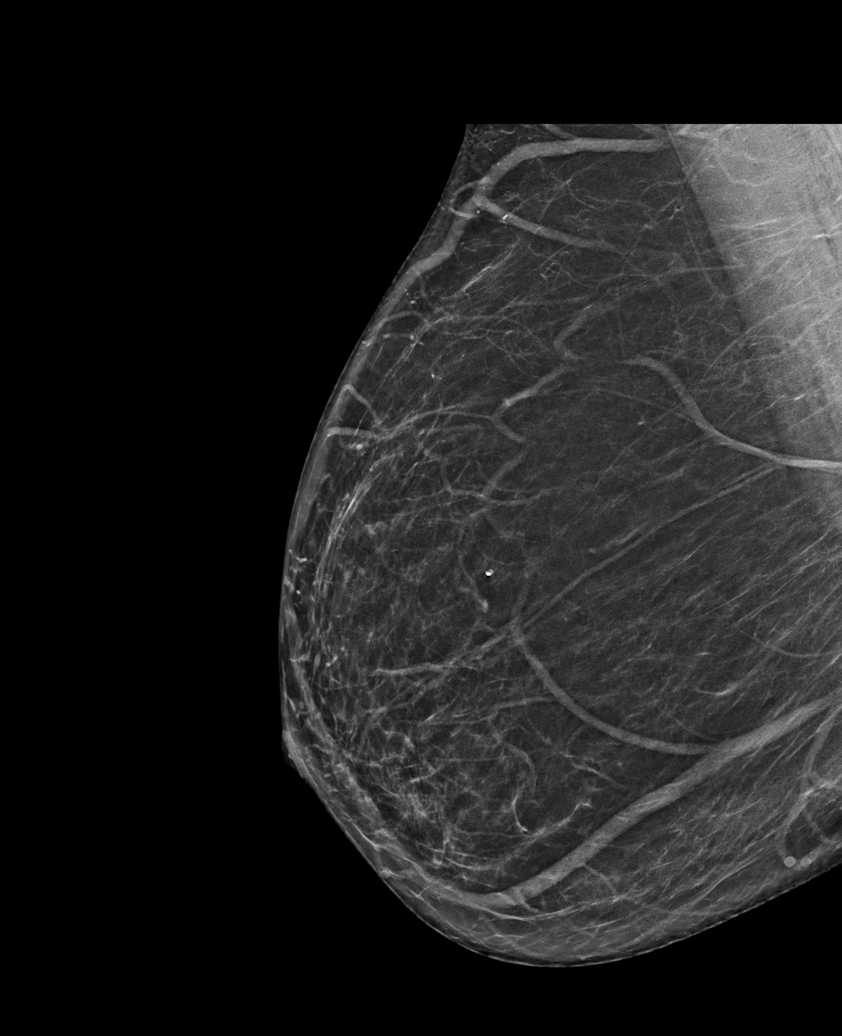

[R CC synth-2D (2 of 2)]
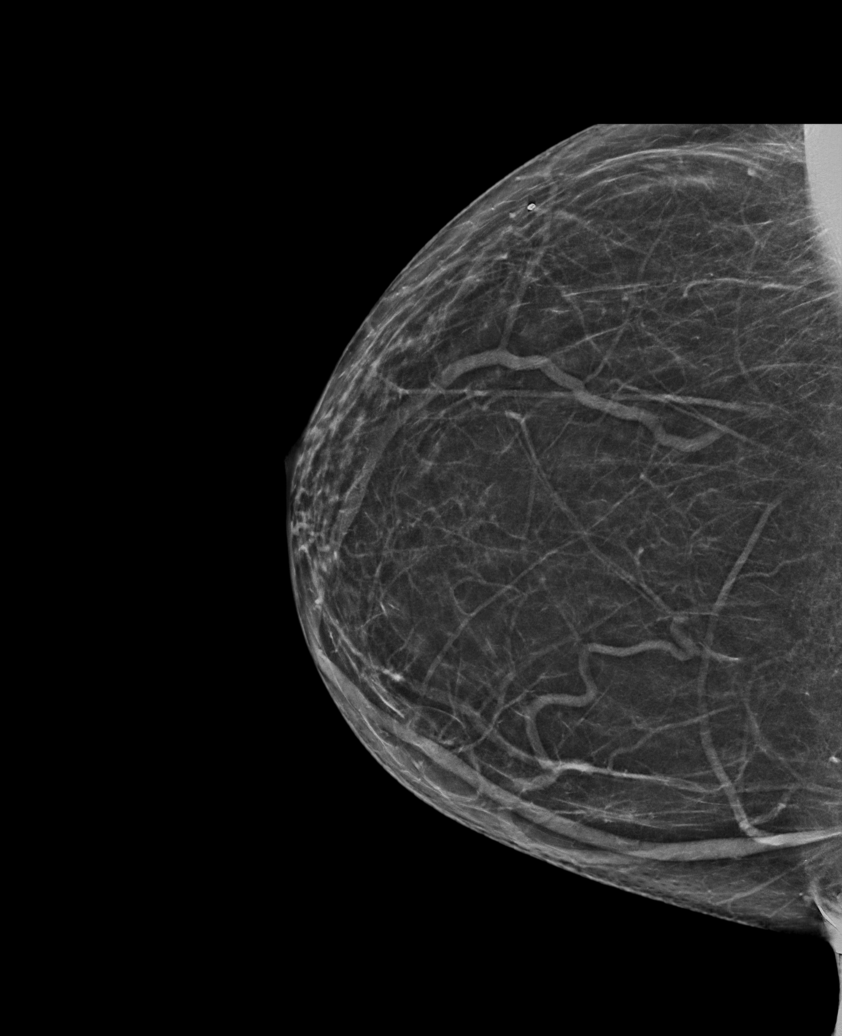

[R CC tomo · tomo slice 33/65.0]
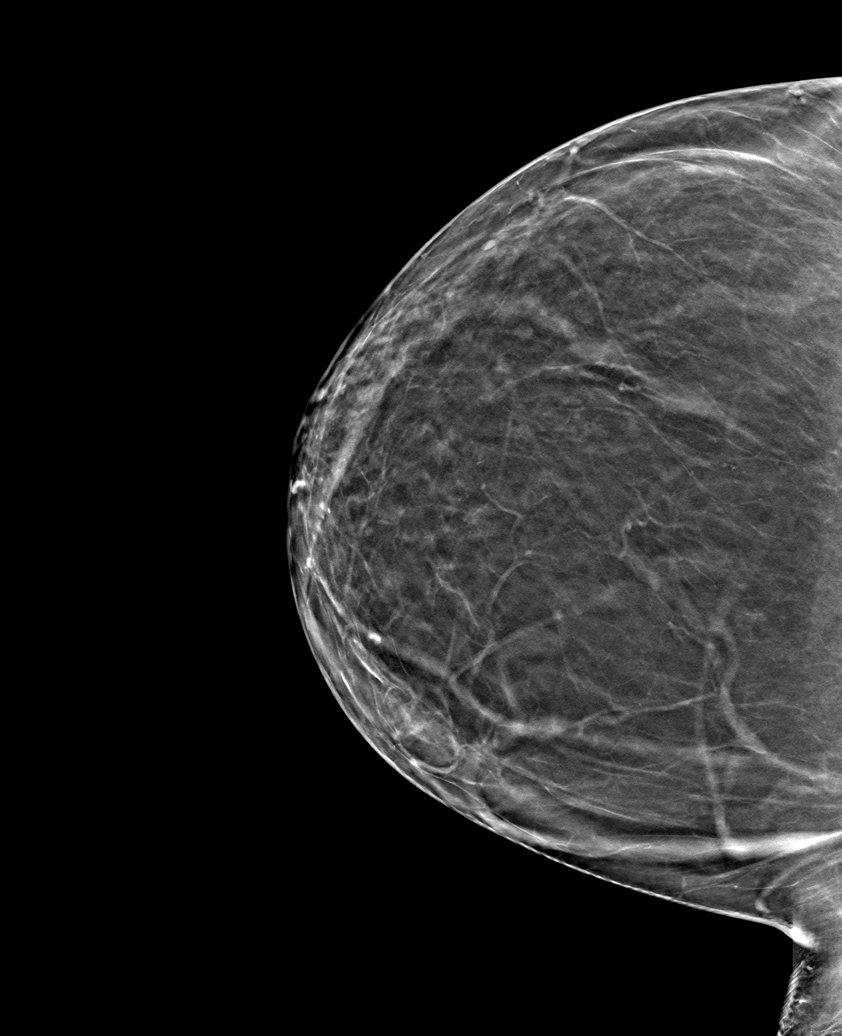

[6 of 30 positions shown; findings below may reference images not displayed]

FINDINGS: There are no findings suspicious for malignancy. Images were
processed with CAD.
IMPRESSION: No mammographic evidence of malignancy. A result letter of this
screening mammogram will be mailed directly to the patient.

RECOMMENDATION:
Screening mammogram in one year. (Code:8Y-Q-VVS)

BI-RADS CATEGORY  1: Negative.

## 2018-10-27 DIAGNOSIS — Z79899 Other long term (current) drug therapy: Secondary | ICD-10-CM | POA: Diagnosis not present

## 2018-10-27 DIAGNOSIS — Z944 Liver transplant status: Secondary | ICD-10-CM | POA: Diagnosis not present

## 2018-12-26 ENCOUNTER — Other Ambulatory Visit: Payer: Self-pay | Admitting: Family Medicine

## 2018-12-26 DIAGNOSIS — Z1231 Encounter for screening mammogram for malignant neoplasm of breast: Secondary | ICD-10-CM

## 2019-02-07 ENCOUNTER — Ambulatory Visit
Admission: RE | Admit: 2019-02-07 | Discharge: 2019-02-07 | Disposition: A | Discharge status: Home / Self Care | Payer: 59 | Source: Ambulatory Visit | Attending: Family Medicine | Admitting: Family Medicine

## 2019-02-07 ENCOUNTER — Other Ambulatory Visit: Payer: Self-pay

## 2019-02-07 DIAGNOSIS — Z1231 Encounter for screening mammogram for malignant neoplasm of breast: Secondary | ICD-10-CM

## 2019-02-08 ENCOUNTER — Other Ambulatory Visit: Payer: Self-pay | Admitting: Family Medicine

## 2019-02-08 DIAGNOSIS — R928 Other abnormal and inconclusive findings on diagnostic imaging of breast: Secondary | ICD-10-CM

## 2019-02-10 ENCOUNTER — Ambulatory Visit
Admission: RE | Admit: 2019-02-10 | Discharge: 2019-02-10 | Disposition: A | Discharge status: Home / Self Care | Payer: 59 | Source: Ambulatory Visit | Attending: Family Medicine | Admitting: Family Medicine

## 2019-02-10 ENCOUNTER — Other Ambulatory Visit: Payer: Self-pay

## 2019-02-10 DIAGNOSIS — R928 Other abnormal and inconclusive findings on diagnostic imaging of breast: Secondary | ICD-10-CM

## 2019-03-31 DIAGNOSIS — Z944 Liver transplant status: Secondary | ICD-10-CM | POA: Diagnosis not present

## 2019-03-31 DIAGNOSIS — Z79899 Other long term (current) drug therapy: Secondary | ICD-10-CM | POA: Diagnosis not present

## 2019-05-19 DIAGNOSIS — Z7982 Long term (current) use of aspirin: Secondary | ICD-10-CM | POA: Diagnosis not present

## 2019-05-19 DIAGNOSIS — F1290 Cannabis use, unspecified, uncomplicated: Secondary | ICD-10-CM | POA: Diagnosis not present

## 2019-05-19 DIAGNOSIS — L905 Scar conditions and fibrosis of skin: Secondary | ICD-10-CM | POA: Diagnosis not present

## 2019-05-19 DIAGNOSIS — Z6837 Body mass index (BMI) 37.0-37.9, adult: Secondary | ICD-10-CM | POA: Diagnosis not present

## 2019-05-19 DIAGNOSIS — F329 Major depressive disorder, single episode, unspecified: Secondary | ICD-10-CM | POA: Diagnosis not present

## 2019-05-19 DIAGNOSIS — Z944 Liver transplant status: Secondary | ICD-10-CM | POA: Diagnosis not present

## 2019-05-19 DIAGNOSIS — R161 Splenomegaly, not elsewhere classified: Secondary | ICD-10-CM | POA: Diagnosis not present

## 2019-05-19 DIAGNOSIS — E039 Hypothyroidism, unspecified: Secondary | ICD-10-CM | POA: Diagnosis not present

## 2019-05-19 DIAGNOSIS — Z79899 Other long term (current) drug therapy: Secondary | ICD-10-CM | POA: Diagnosis not present

## 2019-06-27 DIAGNOSIS — Z944 Liver transplant status: Secondary | ICD-10-CM | POA: Diagnosis not present

## 2019-06-27 DIAGNOSIS — Z79899 Other long term (current) drug therapy: Secondary | ICD-10-CM | POA: Diagnosis not present

## 2019-08-04 ENCOUNTER — Other Ambulatory Visit (HOSPITAL_COMMUNITY)
Admission: RE | Admit: 2019-08-04 | Discharge: 2019-08-04 | Disposition: A | Discharge status: Home / Self Care | Payer: 59 | Source: Ambulatory Visit | Attending: Family Medicine | Admitting: Family Medicine

## 2019-08-04 ENCOUNTER — Other Ambulatory Visit: Payer: Self-pay | Admitting: Family Medicine

## 2019-08-04 DIAGNOSIS — Z13220 Encounter for screening for lipoid disorders: Secondary | ICD-10-CM | POA: Diagnosis not present

## 2019-08-04 DIAGNOSIS — Z124 Encounter for screening for malignant neoplasm of cervix: Secondary | ICD-10-CM | POA: Insufficient documentation

## 2019-08-04 DIAGNOSIS — Z0000 Encounter for general adult medical examination without abnormal findings: Secondary | ICD-10-CM | POA: Diagnosis not present

## 2019-08-04 DIAGNOSIS — E559 Vitamin D deficiency, unspecified: Secondary | ICD-10-CM | POA: Diagnosis not present

## 2019-08-04 DIAGNOSIS — E039 Hypothyroidism, unspecified: Secondary | ICD-10-CM | POA: Diagnosis not present

## 2019-08-07 DIAGNOSIS — Z1211 Encounter for screening for malignant neoplasm of colon: Secondary | ICD-10-CM | POA: Diagnosis not present

## 2019-08-15 LAB — CYTOLOGY - PAP
Comment: NEGATIVE
Diagnosis: NEGATIVE
High risk HPV: NEGATIVE

## 2019-10-02 DIAGNOSIS — Z79899 Other long term (current) drug therapy: Secondary | ICD-10-CM | POA: Diagnosis not present

## 2019-10-02 DIAGNOSIS — Z944 Liver transplant status: Secondary | ICD-10-CM | POA: Diagnosis not present

## 2019-10-10 DIAGNOSIS — Z944 Liver transplant status: Secondary | ICD-10-CM | POA: Diagnosis not present

## 2019-10-10 DIAGNOSIS — Z79899 Other long term (current) drug therapy: Secondary | ICD-10-CM | POA: Diagnosis not present

## 2019-10-18 DIAGNOSIS — R945 Abnormal results of liver function studies: Secondary | ICD-10-CM | POA: Diagnosis not present

## 2019-10-18 DIAGNOSIS — Z4823 Encounter for aftercare following liver transplant: Secondary | ICD-10-CM | POA: Diagnosis not present

## 2019-10-18 DIAGNOSIS — Z944 Liver transplant status: Secondary | ICD-10-CM | POA: Diagnosis not present

## 2019-11-09 DIAGNOSIS — Z79899 Other long term (current) drug therapy: Secondary | ICD-10-CM | POA: Diagnosis not present

## 2019-11-09 DIAGNOSIS — Z944 Liver transplant status: Secondary | ICD-10-CM | POA: Diagnosis not present

## 2019-12-08 DIAGNOSIS — Z944 Liver transplant status: Secondary | ICD-10-CM | POA: Diagnosis not present

## 2019-12-08 DIAGNOSIS — Z79899 Other long term (current) drug therapy: Secondary | ICD-10-CM | POA: Diagnosis not present

## 2020-01-02 ENCOUNTER — Other Ambulatory Visit: Payer: Self-pay | Admitting: Family Medicine

## 2020-01-02 ENCOUNTER — Ambulatory Visit
Admission: RE | Admit: 2020-01-02 | Discharge: 2020-01-02 | Disposition: A | Discharge status: Home / Self Care | Payer: BC Managed Care – PPO | Source: Ambulatory Visit | Attending: Family Medicine | Admitting: Family Medicine

## 2020-01-02 DIAGNOSIS — S90121A Contusion of right lesser toe(s) without damage to nail, initial encounter: Secondary | ICD-10-CM

## 2020-01-02 DIAGNOSIS — S9031XA Contusion of right foot, initial encounter: Secondary | ICD-10-CM

## 2020-01-02 DIAGNOSIS — F439 Reaction to severe stress, unspecified: Secondary | ICD-10-CM | POA: Diagnosis not present

## 2020-01-02 DIAGNOSIS — M19071 Primary osteoarthritis, right ankle and foot: Secondary | ICD-10-CM | POA: Diagnosis not present

## 2020-01-02 DIAGNOSIS — Z944 Liver transplant status: Secondary | ICD-10-CM | POA: Diagnosis not present

## 2020-01-08 DIAGNOSIS — Z79899 Other long term (current) drug therapy: Secondary | ICD-10-CM | POA: Diagnosis not present

## 2020-01-08 DIAGNOSIS — Z944 Liver transplant status: Secondary | ICD-10-CM | POA: Diagnosis not present

## 2020-02-05 ENCOUNTER — Other Ambulatory Visit: Payer: Self-pay | Admitting: Family Medicine

## 2020-02-05 DIAGNOSIS — Z1231 Encounter for screening mammogram for malignant neoplasm of breast: Secondary | ICD-10-CM

## 2020-02-08 DIAGNOSIS — Z79899 Other long term (current) drug therapy: Secondary | ICD-10-CM | POA: Diagnosis not present

## 2020-02-08 DIAGNOSIS — Z944 Liver transplant status: Secondary | ICD-10-CM | POA: Diagnosis not present

## 2020-02-20 ENCOUNTER — Ambulatory Visit: Payer: BC Managed Care – PPO

## 2020-03-08 DIAGNOSIS — R197 Diarrhea, unspecified: Secondary | ICD-10-CM | POA: Diagnosis not present

## 2020-04-08 ENCOUNTER — Ambulatory Visit: Payer: BC Managed Care – PPO
